# Patient Record
Sex: Female | Born: 1960 | Race: Black or African American | Hispanic: No | Smoking: Current every day smoker
Health system: Southern US, Community
[De-identification: ages and names within clinical notes are randomized; demographics above are authoritative.]

## PROBLEM LIST (undated history)

## (undated) ENCOUNTER — Ambulatory Visit
Admission: RE | Payer: No Typology Code available for payment source | Source: Ambulatory Visit | Admitting: Cardiovascular Disease

## (undated) DIAGNOSIS — F40240 Claustrophobia: Secondary | ICD-10-CM

## (undated) DIAGNOSIS — E042 Nontoxic multinodular goiter: Secondary | ICD-10-CM

## (undated) DIAGNOSIS — R262 Difficulty in walking, not elsewhere classified: Secondary | ICD-10-CM

## (undated) DIAGNOSIS — R0602 Shortness of breath: Secondary | ICD-10-CM

## (undated) DIAGNOSIS — F32A Depression, unspecified: Secondary | ICD-10-CM

## (undated) DIAGNOSIS — M1990 Unspecified osteoarthritis, unspecified site: Secondary | ICD-10-CM

## (undated) DIAGNOSIS — J302 Other seasonal allergic rhinitis: Secondary | ICD-10-CM

## (undated) DIAGNOSIS — G709 Myoneural disorder, unspecified: Secondary | ICD-10-CM

## (undated) DIAGNOSIS — Z973 Presence of spectacles and contact lenses: Secondary | ICD-10-CM

## (undated) DIAGNOSIS — R42 Dizziness and giddiness: Secondary | ICD-10-CM

## (undated) DIAGNOSIS — M545 Low back pain, unspecified: Secondary | ICD-10-CM

## (undated) DIAGNOSIS — E079 Disorder of thyroid, unspecified: Secondary | ICD-10-CM

## (undated) HISTORY — PX: BIOPSY THYROID: SHX51068

## (undated) HISTORY — DX: Presence of spectacles and contact lenses: Z97.3

## (undated) HISTORY — DX: Claustrophobia: F40.240

## (undated) HISTORY — DX: Dizziness and giddiness: R42

## (undated) HISTORY — DX: Disorder of thyroid, unspecified: E07.9

---

## 1998-08-29 ENCOUNTER — Emergency Department: Admit: 1998-08-29 | Payer: Self-pay | Admitting: Emergency Medicine

## 2001-08-06 ENCOUNTER — Emergency Department: Admit: 2001-08-06 | Payer: Self-pay | Admitting: Emergency Medicine

## 2004-03-20 ENCOUNTER — Emergency Department: Admit: 2004-03-20 | Payer: Self-pay | Source: Emergency Department | Admitting: Internal Medicine

## 2007-10-30 ENCOUNTER — Emergency Department: Admit: 2007-10-30 | Payer: Self-pay | Source: Emergency Department | Admitting: Emergency Medicine

## 2008-07-03 ENCOUNTER — Emergency Department: Admit: 2008-07-03 | Payer: Self-pay | Source: Emergency Department | Admitting: Emergency Medicine

## 2008-07-03 LAB — CBC AND DIFFERENTIAL
Basophils Absolute: 0 /mm3 (ref 0.0–0.2)
Basophils: 1 % (ref 0–2)
Eosinophils Absolute: 0.3 /mm3 (ref 0.0–0.7)
Eosinophils: 4 % (ref 0–5)
Granulocytes Absolute: 2.6 /mm3 (ref 1.8–8.1)
Hematocrit: 35.9 % — ABNORMAL LOW (ref 37.0–47.0)
Hgb: 11.8 G/DL — ABNORMAL LOW (ref 12.0–16.0)
Immature Granulocytes Absolute: 0 CUMM (ref 0.0–0.0)
Immature Granulocytes: 0 % (ref 0–1)
Lymphocytes Absolute: 3.8 /mm3 (ref 0.5–4.4)
Lymphocytes: 53 % — ABNORMAL HIGH (ref 15–41)
MCH: 29.6 PG (ref 28.0–32.0)
MCHC: 32.9 G/DL (ref 32.0–36.0)
MCV: 90 FL (ref 80.0–100.0)
MPV: 9.3 FL — ABNORMAL LOW (ref 9.4–12.3)
Monocytes Absolute: 0.5 /mm3 (ref 0.0–1.2)
Monocytes: 7 % (ref 0–11)
Neutrophils %: 36 % — ABNORMAL LOW (ref 52–75)
Platelets: 324 /mm3 (ref 140–400)
RBC: 3.99 /mm3 — ABNORMAL LOW (ref 4.20–5.40)
RDW: 12.9 % (ref 11.5–15.0)
WBC: 7.25 /mm3 (ref 3.50–10.80)

## 2008-07-03 LAB — BASIC METABOLIC PANEL
BUN: 13 MG/DL (ref 7–21)
CO2: 31 MEQ/L (ref 22–31)
Calcium: 9.7 MG/DL (ref 8.6–10.2)
Chloride: 105 MEQ/L (ref 98–107)
Creatinine: 0.8 MG/DL (ref 0.5–1.4)
Glucose: 86 MG/DL (ref 70–105)
Potassium: 4.3 MEQ/L (ref 3.6–5.0)
Sodium: 141 MEQ/L (ref 136–143)

## 2008-07-03 LAB — GFR

## 2010-01-07 ENCOUNTER — Emergency Department: Admit: 2010-01-07 | Payer: Self-pay | Source: Emergency Department | Admitting: Emergency Medicine

## 2010-01-25 ENCOUNTER — Emergency Department: Admit: 2010-01-25 | Payer: Self-pay | Source: Emergency Department | Admitting: Family

## 2011-04-27 ENCOUNTER — Ambulatory Visit: Admit: 2011-04-27 | Discharge: 2011-04-27 | Payer: Self-pay | Source: Ambulatory Visit

## 2011-05-10 ENCOUNTER — Ambulatory Visit: Admit: 2011-05-10 | Discharge: 2011-05-10 | Payer: Self-pay | Source: Ambulatory Visit

## 2011-07-21 LAB — ECG 12-LEAD
Atrial Rate: 84 {beats}/min
P Axis: 53 degrees
P-R Interval: 144 ms
Q-T Interval: 352 ms
QRS Duration: 76 ms
QTC Calculation (Bezet): 415 ms
R Axis: -20 degrees
T Axis: -3 degrees
Ventricular Rate: 84 {beats}/min

## 2012-08-08 ENCOUNTER — Emergency Department: Payer: Self-pay

## 2012-08-08 ENCOUNTER — Observation Stay: Payer: Self-pay | Admitting: Internal Medicine

## 2012-08-08 ENCOUNTER — Observation Stay
Admission: EM | Admit: 2012-08-08 | Discharge: 2012-08-10 | Disposition: A | Discharge status: Home / Self Care | Payer: Self-pay | Attending: Internal Medicine | Admitting: Internal Medicine

## 2012-08-08 ENCOUNTER — Observation Stay: Payer: Self-pay

## 2012-08-08 DIAGNOSIS — R202 Paresthesia of skin: Secondary | ICD-10-CM | POA: Diagnosis present

## 2012-08-08 DIAGNOSIS — Z720 Tobacco use: Secondary | ICD-10-CM

## 2012-08-08 DIAGNOSIS — R930 Abnormal findings on diagnostic imaging of skull and head, not elsewhere classified: Secondary | ICD-10-CM | POA: Insufficient documentation

## 2012-08-08 DIAGNOSIS — R0609 Other forms of dyspnea: Secondary | ICD-10-CM | POA: Insufficient documentation

## 2012-08-08 DIAGNOSIS — F17200 Nicotine dependence, unspecified, uncomplicated: Secondary | ICD-10-CM | POA: Insufficient documentation

## 2012-08-08 DIAGNOSIS — Z8249 Family history of ischemic heart disease and other diseases of the circulatory system: Secondary | ICD-10-CM | POA: Insufficient documentation

## 2012-08-08 DIAGNOSIS — D649 Anemia, unspecified: Secondary | ICD-10-CM | POA: Insufficient documentation

## 2012-08-08 DIAGNOSIS — Z823 Family history of stroke: Secondary | ICD-10-CM | POA: Insufficient documentation

## 2012-08-08 DIAGNOSIS — R0989 Other specified symptoms and signs involving the circulatory and respiratory systems: Secondary | ICD-10-CM | POA: Insufficient documentation

## 2012-08-08 DIAGNOSIS — R269 Unspecified abnormalities of gait and mobility: Secondary | ICD-10-CM | POA: Insufficient documentation

## 2012-08-08 DIAGNOSIS — E669 Obesity, unspecified: Secondary | ICD-10-CM | POA: Insufficient documentation

## 2012-08-08 DIAGNOSIS — R42 Dizziness and giddiness: Secondary | ICD-10-CM | POA: Insufficient documentation

## 2012-08-08 DIAGNOSIS — R209 Unspecified disturbances of skin sensation: Principal | ICD-10-CM | POA: Insufficient documentation

## 2012-08-08 DIAGNOSIS — G43909 Migraine, unspecified, not intractable, without status migrainosus: Secondary | ICD-10-CM | POA: Insufficient documentation

## 2012-08-08 DIAGNOSIS — E041 Nontoxic single thyroid nodule: Secondary | ICD-10-CM | POA: Insufficient documentation

## 2012-08-08 DIAGNOSIS — E042 Nontoxic multinodular goiter: Secondary | ICD-10-CM | POA: Clinically undetermined

## 2012-08-08 LAB — URINALYSIS, REFLEX TO MICROSCOPIC EXAM IF INDICATED
Blood, UA: NEGATIVE
Glucose, UA: NEGATIVE
Ketones UA: NEGATIVE
Nitrite, UA: NEGATIVE
Protein, UR: 30 — AB
Specific Gravity UA: 1.02 (ref 1.001–1.035)
Urine pH: 8 (ref 5.0–8.0)
Urobilinogen, UA: NORMAL mg/dL

## 2012-08-08 LAB — POCT GLUCOSE: Whole Blood Glucose POCT: 87 mg/dL (ref 70–100)

## 2012-08-08 LAB — GFR: EGFR: >60

## 2012-08-08 LAB — CBC AND DIFFERENTIAL
Basophils Absolute Automated: 0.07 10*3/uL (ref 0.00–0.20)
Basophils Automated: 1 % (ref 0–2)
Eosinophils Absolute Automated: 0.28 10*3/uL (ref 0.00–0.70)
Eosinophils Automated: 4 % (ref 0–5)
Hematocrit: 35.4 % — ABNORMAL LOW (ref 37.0–47.0)
Hgb: 11.6 g/dL — ABNORMAL LOW (ref 12.0–16.0)
Immature Granulocytes Absolute: 0.01 10*3/uL
Immature Granulocytes: 0 % (ref 0–1)
Lymphocytes Absolute Automated: 3.67 10*3/uL (ref 0.50–4.40)
Lymphocytes Automated: 55 % — ABNORMAL HIGH (ref 15–41)
MCH: 29.1 pg (ref 28.0–32.0)
MCHC: 32.8 g/dL (ref 32.0–36.0)
MCV: 88.9 fL (ref 80.0–100.0)
MPV: 9 fL — ABNORMAL LOW (ref 9.4–12.3)
Monocytes Absolute Automated: 0.37 10*3/uL (ref 0.00–1.20)
Monocytes: 6 % (ref 0–11)
Neutrophils Absolute: 2.32 10*3/uL (ref 1.80–8.10)
Neutrophils: 35 % — ABNORMAL LOW (ref 52–75)
Nucleated RBC: 0 /100 WBC (ref 0–1)
Platelets: 306 10*3/uL (ref 140–400)
RBC: 3.98 10*6/uL — ABNORMAL LOW (ref 4.20–5.40)
RDW: 13 % (ref 12–15)
WBC: 6.71 10*3/uL (ref 3.50–10.80)

## 2012-08-08 LAB — COMPREHENSIVE METABOLIC PANEL
ALT: 7 U/L (ref 0–55)
AST (SGOT): 12 U/L (ref 5–34)
Albumin/Globulin Ratio: 1 (ref 0.9–2.2)
Albumin: 3.7 g/dL (ref 3.5–5.0)
Alkaline Phosphatase: 67 U/L (ref 40–150)
BUN: 9 mg/dL (ref 7–21)
Bilirubin, Total: 0.3 mg/dL (ref 0.2–1.2)
CO2: 27 mEq/L (ref 22–29)
Calcium: 9.5 mg/dL (ref 8.5–10.5)
Chloride: 106 mEq/L (ref 98–107)
Creatinine: 0.8 mg/dL (ref 0.6–1.0)
Globulin: 3.6 g/dL (ref 2.0–3.6)
Glucose: 87 mg/dL (ref 70–100)
Potassium: 4.2 mEq/L (ref 3.5–5.1)
Protein, Total: 7.3 g/dL (ref 6.0–8.3)
Sodium: 140 mEq/L (ref 136–145)

## 2012-08-08 LAB — VITAMIN B12: Vitamin B-12: 334 pg/mL (ref 211–911)

## 2012-08-08 LAB — URINE ICTOTEST

## 2012-08-08 LAB — TSH: TSH: 1.59 u[IU]/mL (ref 0.35–4.94)

## 2012-08-08 MED ORDER — LORAZEPAM 2 MG/ML IJ SOLN
2.00 mg | Freq: Once | INTRAMUSCULAR | Status: AC
Start: 2012-08-08 — End: 2012-08-08
  Administered 2012-08-08: 2 mg via INTRAVENOUS
  Filled 2012-08-08: qty 1

## 2012-08-08 MED ORDER — ASPIRIN 325 MG PO TABS
325.00 mg | ORAL_TABLET | Freq: Every day | ORAL | Status: AC
Start: 2012-08-09 — End: 2012-08-09
  Administered 2012-08-09: 325 mg via ORAL
  Filled 2012-08-08: qty 1

## 2012-08-08 MED ORDER — ASPIRIN 81 MG PO CHEW
81.00 mg | CHEWABLE_TABLET | Freq: Every day | ORAL | Status: DC
Start: 2012-08-08 — End: 2012-08-08
  Administered 2012-08-08: 81 mg via ORAL
  Filled 2012-08-08: qty 1

## 2012-08-08 NOTE — ED Provider Notes (Signed)
Physician/Midlevel provider first contact with patient: 08/08/12 1349         History     Chief Complaint   Patient presents with   . Numbness     Patient is a 51 y.o. female presenting with neurologic complaint. The history is provided by the patient. No language interpreter was used.   Neurologic Problem  The primary symptoms include dizziness (for the past 3 days). Episode onset: 3 days ago. The symptoms are unchanged. Context: while working.   Onset quality: 3 days ago. The dizziness has been unchanged since its onset. It is a new problem. Associated with: unknown. Dizziness does not occur with weakness.   Additional symptoms do not include weakness.         51 y/o female with no MHx ad FHx of stroke (mother, sister and brother), presents to ED c/o dizziness with numbness on left side of body (left arm, hand, and leg) for the past 3 days. Pt states the dizziness came on suddenly and she experienced some numbness in her left hand on the first day and then numbness in her left leg on the second day. Pt states while she's at work she has to sit down due to the dizziness. Pt has never had previous similar sx in the past. Pt smokes cigarettes. Pt unsure of last physical exam. Pt denies weakness, CP, or SOB. History reviewed. No pertinent past medical history.    History reviewed. No pertinent past surgical history.    History reviewed. No pertinent family history.    Social  History   Substance Use Topics   . Smoking status: Current Every Day Smoker -- 4.0 packs/day for 40 years     Types: Cigarettes   . Smokeless tobacco: Not on file   . Alcohol Use: No       .     No Known Allergies    Current/Home Medications    No medications on file        Review of Systems   Respiratory: Negative for shortness of breath.    Cardiovascular: Negative for chest pain.   Neurological: Positive for dizziness (for the past 3 days) and numbness (left side (left arm, hand, and leg) for the past 3 days). Negative for weakness.   All  other systems reviewed and are negative.        Physical Exam    BP 128/79  Pulse 77  Temp 97.6 F (36.4 C) (Oral)  Resp 16  Ht 1.524 m  Wt 99.791 kg  BMI 42.97 kg/m2  SpO2 98%    Physical Exam  Nursing note and vitals reviewed.  Constitutional:  Well developed, well nourished. Awake & Oriented x3.  Head:  Atraumatic. Normocephalic.    Eyes:  PERRL. EOMI. Conjunctivae are not pale.  ENT:  Mucous membranes are moist and intact. Oropharynx is clear and symmetric.  Patent airway.  Neck:  Supple. Full ROM.    Cardiovascular:  Regular rate. Regular rhythm. No murmurs, rubs, or gallops.  Pulmonary/Chest:  No evidence of respiratory distress. Clear to auscultation bilaterally.  No wheezing, rales or rhonchi.   Abdominal:  Soft and non-distended. There is no tenderness. No rebound, guarding, or rigidity.  Back:  Full ROM. Nontender.  Extremities:  No edema. No cyanosis. No clubbing. Full range of motion in all extremities.  Skin:  Skin is warm and dry.  No diaphoresis. No rash.   Neurological:  Alert, awake, and appropriate. Normal speech. Motor normal.  Psychiatric:  Good eye contact. Normal interaction, affect, and behavior.      MDM and ED Course     ED Medication Orders      Start     Status Ordering Provider    08/08/12 2045   LORazepam (ATIVAN) injection 2 mg   Once      Route: Intravenous  Ordered Dose: 2 mg         Last MAR action:  Given Merranda Bolls C                 MDM  LABORATORY RESULTS: Ordered and independently interpreted available laboratory tests.  Labs Reviewed   CBC AND DIFFERENTIAL - Abnormal; Notable for the following:     RBC 3.98 (*)      Hgb 11.6 (*)      Hematocrit 35.4 (*)      MPV 9.0 (*)      Neutrophils 35 (*)      Lymphocytes Automated 55 (*)      All other components within normal limits   URINALYSIS, REFLEX TO MICROSCOPIC EXAM IF INDICATED - Abnormal; Notable for the following:     Leukocytes, UA Small (*)      Protein, UA 30 (*)      All other components within normal limits    LIPID PANEL - Abnormal; Notable for the following:     HDL 32 (*)      LDL Calculated 125 (*)      All other components within normal limits   BASIC METABOLIC PANEL - Abnormal; Notable for the following:     Glucose 117 (*)      All other components within normal limits   POCT GLUCOSE   COMPREHENSIVE METABOLIC PANEL   GFR   TSH   VITAMIN B12   ANA   HEMOGLOBIN A1C   URINE ICTOTEST   HEMOLYZED INDEX   GFR       IMAGING STUDIES: Imaging studies were ordered.   CT HEAD WO CONTRAST    Final Result:   No acute intracranial abnormality. Extensive paranasal     sinus disease involving the ethmoid sinuses bilaterally and left    maxillary sinus. Left maxillary sinus appears expanded and underlying    mass cannot be excluded.   XR CHEST AP PORTABLE    Final Result:  No acute cardiopulmonary process.        MRI BRAIN WO CONTRAST    Final Result:       1. No acute intracranial findings.    2. Extensive paranasal sinus inflammatory change, most notably of the    right left maxillary sinus which is completely opacified. An underlying    mass is not excluded.    3. Apparent expansion of the sella turcica without definite mass seen.    Comparison with prior would be helpful; otherwise, this can be further    evaluated with MRI with pituitary protocol.    4. Decreased T1 marrow signal within the upper cervical spine, could    represent sclerosis; however, is indeterminate.  Cervical MR is    recommended for further evaluation.   US CAROTID DUPLEX DOPP COMP BILATERAL    Final Result:       1. Moderately elevated peak systolic velocities within the ICAs    bilaterally, without high-grade stenosis seen.      2. A 2.3 cm cystic and solid right thyroid nodule. Dedicated ultrasound    of the thyroid is recommended for further evaluation.  CT CERVICAL SPINE W WO CONTRAST    Final Result:  Degenerative changes involving the cervical spine. No focal     lesion is noted within the bone marrow. The prominent decreased signal    intensity  within the bone marrow on MRI may be related to a marrow    replacement process such as myelofibrosis or a metabolic abnormality.         Sinusitis.         Enlarged adenoids and palatine tonsils.         Large sella turcica.         Thyroid nodules.         [place] HEAD NECK SOFT TISSUE    Final Result:      1. Bilateral thyroid lesions as outlined above. The gland is enlarged    and the findings are most consistent with a multinodular goiter. There    is a dominant mass within the right lobe of the gland which demonstrates    prominent internal vascularity. Fine needle aspiration of this lesion is    recommended and may be performed on an elective outpatient basis.   US CAROTID DUPLEX DOPP LTD LEFT    (Results Pending)   MRI BRAIN W WO CONTRAST    (Results Pending)       MEDICAL DECISION MAKING:    Oxygen Saturation by Pulse Oximetry: 98%  Interventions: Patient Observed.  Interpretation: Normal    Vital Signs: Reviewed the patient's vital signs.   Nursing Notes: Reviewed and utilized available nursing notes.  Medical Records Reviewed: Reviewed available past medical records.    Cardiac Monitor Interpretation:  Rate: 95  Rhythm: NSR    EKG Interpretation:   Signed and interpreted by the EP.   Time Interpreted: 220  Rate: 80  Rhythm: nsr  Interpretation: low voltage.  Normal axis and intervals.  Comparison:    Counseling: The emergency provider has spoken with the patient and discussed today's findings, in addition to providing specific details for the plan of care. Questions are answered and there is agreement with the plan.     Scribe Attestation: Barrister's clerk by Corinne Ports, acting as scribe for, and in the presence of Kennith Maes, MD .     Physician Attestation: I, Kennith Maes, MD, have been the primary provider for Bonnielee Haff during this Emergency Dept visit and have reviewed the chart documented by the scribe for accuracy and agree with its content.    DW DR. Melvenia Beam WHO WILL ADMIT FOR PARESTHESIA AND DR  Welton Flakes NEUROLOGY WHO WILL CONSULT.  INITIAL CT DONE TO RO BLEED OR LARGE CVA NEGATIVE.  NO TPA AS SX FOR 3 DAYS AND NOT CLEARLY CVA.  Procedures    Clinical Impression & Disposition     Clinical Impression  Final diagnoses:   Paresthesias   Thyroid nodule   Tobacco abuse        ED Disposition     Observation Admitting Physician: Mardene Celeste [[id number]]  Diagnosis: Paresthesias [227675]  Estimated Length of Stay: < 2 midnights  Tentative Discharge Plan?: Home or Self Care [1]  Patient Class: Observation [104]             New Prescriptions    No medications on file               Kennith Maes, MD  08/09/12 [id number]

## 2012-08-08 NOTE — Plan of Care (Signed)
A&O x3. NIH strokes scale completed after pt arrived to unit. No reports of any dizziness, and pt stated her numbness has resolved. NSR on tele monitor, VSS no signs or reports of any distress by patient. Seen by Dr. Melvenia Beam in room. SCD's applied per order. Pt oriented to room and bed. Call bell and table within reach. Will continue to monitor hourly.

## 2012-08-08 NOTE — Consults (Signed)
Service Date: 08/08/2012     Patient Type: V     CONSULTING PHYSICIAN: Leitha Bleak MD     REFERRING PHYSICIAN:      CONSULTING SERVICE:  Neurology.     REASON FOR CONSULTATION:  Paresthesias.     HISTORY OF PRESENT ILLNESS:  This is a 51 year old right-handed female whose past medical history the  patient says is not significant for any medical problems other than  depression and migraine.  The patient recently presented 3 days ago started  having dizziness which he described as lightheadedness and continued to  have that getting worse.  No speech, memory problems.  The patient had some  symptoms on her face which has resolved, continued to have dizziness and  paresthesias on the left side.  She denies having headaches, had migraine  in the past which was different from this.  Never had symptoms like this  before.  The patient admits having cough and recent flu.     PAST MEDICAL HISTORY:  Otherwise unremarkable.     SOCIAL HISTORY:  She is a smoker, about 7 cigarettes per day.  Denies drinking.     FAMILY HISTORY:  Sister had a stroke at young age.  Mother had heart problems.     PHYSICAL EXAMINATION:  VITAL SIGNS:  Blood pressure was 143/80 and pulse is 95.  GENERAL:  Head is atraumatic, normocephalic.  LUNGS:  Clear.  HEART:  S1, S2 regular.  NEUROLOGIC:  She is oriented x3.  Denies memory problems, speech is fluent.   Cranial nerve examination:  2-mm reactive pupils.  Extraocular movements  intact.  Visual fields are full.  Face is symmetric.  Tongue, uvula  midline.  Sensation intact.  Hearing is intact.  Motor strength in upper  extremity 5/5, no drift.  Finger-to-nose:  No ataxia.  Lower extremity, she  cannot give full effort, not able to raise it fully up against gravity.   The patient says at home she is able to walk by herself.  Her reflexes are  overall depressed.  She admits to pinprick, temperature, vibration intact  and symmetric.  There is no apparent clonus.     LABORATORY DATA:  Workup shows that  she has WBC of 6.71, hemoglobin of 11, hematocrit 35.4,  and BUN and creatinine 9 and 0.8.  CT scan of the brain was done and showed  no acute abnormalities, but sinus problem.     ASSESSMENT AND PLAN:  This is a 51 year old female who presented with dizziness that started 3  days ago.  The patient continued to have dizziness.  No headache.  The  patient reported left side paresthesias which comes and goes, never had  like this before.  He has history of migraines.  The patient also reported  difficulty sleeping, snoring, tired and fatigued.  Her Mallampati score of  4, impression to me is the patient may have underlying sleep apnea problem  given her exam, her weight, and her symptoms.  I would recommend at this  time doing an MRI excluding any other possible etiology and carotid  Doppler, put her on aspirin, check B12, TSH, ANA as well.  If the workup is  negative, patient to be discharged to follow up with neurology to have a  sleep study done.           D:  08/08/2012 18:20 PM by Dr. Leitha Bleak, MD ([id number])  T:  08/08/2012 22:30 PM by Dionicia Abler      (  Conf: [id number]) (Doc ID: [id number])

## 2012-08-08 NOTE — H&P (Signed)
ADMISSION HISTORY AND PHYSICAL EXAM    Date Time: 08/08/2012 10:35 PM  Patient Name: Kristina [name]  Attending Physician: Eston Esters*  Primary Care Physician: Christa See, MD    CC: Left-sided facial, upper extremity, and lower extremity paresthesias.      Assessment:     51 yo RHD AAF with significant family hx of cardio- and cerebrovascular disease, presenting with unilateral, left-sided paresthesias, etiology unknown, concerning for TIA.    Plan:   1. Admit for observation  2. Continue PO ASA  3. Neuro consultation    Disposition:     Anticipated medical stability for discharge: October,  25 - Afternoon  Service status: Observation: Due to symptoms concerning for cerebral ischemia  Reason for ongoing hospitalization: observation and further evaluation as indicated  Anticipated discharge needs: TBD      History of Presenting Illness:   Kristina [name] is [name] 51 y.o. right-hand dominant female who presents to the hospital with [name] 3d hx of left sided numbness/paresthesias.  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  Subsequent involvement included the left side of the face along the cheek; left flank of the torso, with incomplete extension into the left leg; she denies any involvement of the left breast.  Associated symptoms included vertiginous sensation aggravated by upward gaze and laying flat; improved with eyes in center gaze position and sitting more upright.  Patient reports gait instability from vestibular symptoms but denies any falls.  She also describes left-sided weakness, atlhough it is unclear if she is describing incoordination [[name] proprioceptive problem] vs true loss of power.  Owing to the persistence and progression of her symptoms, she presented to the ER for further evaluation.    Past Medical History:   -no significant PMH  -no chronic medical problems  -no hx of hospitalization  x for childbirth    OB/GYN:  -G1P1001, no complications    Past Surgical History:   -noncontributory    Family History:   -father deceased kidney disease  -son deceased kidney disease  -mother CAD s/p triple bypass  -brother CVA  -sister CVA    Social History:     History   Smoking status   . Current Every Day Smoker -- 4.0 packs/day for 40 years   . Types: Cigarettes     Alcohol Use No       Allergies:   No Known Allergies    Medications:   -aspirin daily    Review of Systems:   Remaining ROS negative/noncontributory    Physical Exam:   Patient Vitals for the past 24 hrs:   BP Temp Temp src Pulse Resp SpO2 Height Weight   08/08/12 2124 139/85 mmHg 98.2 F (36.8 C) - 76  18  98 % - -   08/08/12 1912 136/75 mmHg 98.6 F (37 C) - 80  18  100 % - -   08/08/12 1355 143/80 mmHg 98.2 F (36.8 C) Oral 95  18  97 % 1.524 m (5') 99.791 kg (220 lb)     Body mass index is 42.97 kg/(m^2).  No intake or output data in the 24 hours ending 08/08/12 2235    AFVSS; WDOWAAF, NAD, AAOx4, breathing unlabored  HEENT: PERRL, EOMI without nystagmus or dysmetria; sclerae anicteric, conjunctivae without injection  Neck: supple, no LAD/TTP; carotids brisk/symmetric, no bruits  Cardiovascular: precordium without T/H/L; heart sounds soft, RRR, nl S1/S2,  no m/r/g  Lungs: clear to auscultation bilaterally; good air movement  Abdomen: NABS; soft, non-tender, non-distended  Extremities: no pallor, cyanosis, or edema  Neuro: cranial nerves grossly intact, strength 5/5, sensation grossly intact   Skin: no rashes or lesions noted    Labs:     Results     Procedure Component Value Units Date/Time    Vitamin B12 [[id number]] Collected:08/08/12 1516    Specimen Information:Blood Updated:08/08/12 2142     Vitamin B-12 334 pg/mL     ANA Screen [[id number]] Collected:08/08/12 1516    Specimen Information:Blood Updated:08/08/12 2110    Hemoglobin A1C [[id number]] Collected:08/08/12 1516    Specimen Information:Blood Updated:08/08/12 2108    TSH [[id number]]  Collected:08/08/12 1516    Specimen Information:Blood Updated:08/08/12 1933     Thyroid Stimulating Hormone 1.59 uIU/mL     Urine Ictotest [[id number]] Collected:08/08/12 1845     Urine Ictotest see below Updated:08/08/12 1905    UA, Reflex to Microscopic [[id number]]  (Abnormal) Collected:08/08/12 1845     Urine Type Random Updated:08/08/12 1903     Color, UA Yellow      Clarity, UA Clear      Specific Gravity UA 1.020      Urine pH 8.0      Leukocytes, UA Small ([name])      Nitrite, UA Negative      Protein, UA 30 ([name])      Glucose, UA Negative      Ketones UA Negative      Urobilinogen, UA Normal mg/dL      Bilirubin, UA See Icto      Blood, UA Negative      RBC, UA 0 - 5 /HPF      WBC, UA 0 - 5 /HPF      Squamous Epithelial Cells, Urine 0 - 5 /HPF      Urine Bacteria Rare /HPF     Comprehensive metabolic panel [[id number]] Collected:08/08/12 1516    Specimen Information:Blood Updated:08/08/12 1548     Glucose 87 mg/dL      BUN 9 mg/dL      Creatinine 0.8 mg/dL      Sodium 474 mEq/L      Potassium 4.2 mEq/L      Chloride 106 mEq/L      CO2 27 mEq/L      Calcium 9.5 mg/dL      Protein, Total 7.3 g/dL      Albumin 3.7 g/dL      AST (SGOT) 12 U/L      ALT 7 U/L      Alkaline Phosphatase 67 U/L      Bilirubin, Total 0.3 mg/dL      Globulin 3.6 g/dL      Albumin/Globulin Ratio 1.0     GFR [[id number]] Collected:08/08/12 1516     EGFR >60.0 Updated:08/08/12 1548    CBC and differential [[id number]]  (Abnormal) Collected:08/08/12 1516    Specimen Information:Blood Updated:08/08/12 1522     WBC 6.71 x10 3/uL      RBC 3.98 (L) x10 6/uL      Hgb 11.6 (L) g/dL      Hematocrit 18.8 (L) %      MCV 88.9 fL      MCH 29.1 pg      MCHC 32.8 g/dL      RDW 13 %      Platelets 306 x10 3/uL      MPV 9.0 (L) fL      Neutrophils 35 (L) %  Lymphocytes Automated 55 (H) %      Monocytes 6 %      Eosinophils Automated 4 %      Basophils Automated 1 %      Immature Granulocyte 0 %      Nucleated RBC 0 /100 WBC      Neutrophils Absolute 2.32 x10 3/uL       Abs Lymph Automated 3.67 x10 3/uL      Abs Mono Automated 0.37 x10 3/uL      Abs Eos Automated 0.28 x10 3/uL      Absolute Baso Automated 0.07 x10 3/uL      Absolute Immature Granulocyte 0.01 x10 3/uL     Accucheck [[id number]] Collected:08/08/12 1519     POCT Glucose WB 87 mg/dL [id number]:91/47/82 9562          RADS:   -noncon CT head: Impression: No acute intracranial abnormality. Extensive paranasal  sinus disease involving the ethmoid sinuses bilaterally and left  maxillary sinus. Left maxillary sinus appears expanded and underlying  mass cannot be excluded.    -noncon MRI brain: Impression: 1. No acute intracranial findings. 2. Extensive paranasal sinus inflammatory change, most notably of the right left maxillary sinus which is completely opacified. An underlying mass is not excluded. 3. Apparent expansion of the sella turcica without definite mass seen. Comparison with prior would be helpful; otherwise, this can be further evaluated with MRI with pituitary protocol. 4. Decreased T1 marrow signal within the upper cervical spine, could represent sclerosis; however, is indeterminate. Cervical MR is recommended for further evaluation.    -carotid duplex: Impression: 1. Moderately elevated peak systolic velocities within the ICAs bilaterally, without high-grade stenosis seen. 2. [name] 2.3 cm cystic and solid right thyroid nodule. Dedicated ultrasound of the thyroid is recommended for further evaluation.    Safety Checklist  DVT prophylaxis:  CHEST guideline (See page [id number]) Mechanical   Foley: Not present   IVs:  Peripheral IV   PT/OT: Not needed       Signed by: Mardene Celeste, MD

## 2012-08-08 NOTE — ED Notes (Addendum)
Intermittent dizziness and numbness to left side; denies pain; no chest pain; no  Sob; supine worsens dizziness; standing lessens dizziness

## 2012-08-09 ENCOUNTER — Observation Stay: Payer: Self-pay

## 2012-08-09 DIAGNOSIS — E041 Nontoxic single thyroid nodule: Secondary | ICD-10-CM

## 2012-08-09 DIAGNOSIS — F17200 Nicotine dependence, unspecified, uncomplicated: Secondary | ICD-10-CM

## 2012-08-09 DIAGNOSIS — R209 Unspecified disturbances of skin sensation: Secondary | ICD-10-CM

## 2012-08-09 LAB — ECG 12-LEAD
Atrial Rate: 80 {beats}/min
P Axis: 45 degrees
P-R Interval: 126 ms
Q-T Interval: 368 ms
QRS Duration: 72 ms
QTC Calculation (Bezet): 424 ms
R Axis: -18 degrees
T Axis: -8 degrees
Ventricular Rate: 80 {beats}/min

## 2012-08-09 LAB — BASIC METABOLIC PANEL
BUN: 11 mg/dL (ref 7–21)
CO2: 26 mEq/L (ref 22–29)
Calcium: 9.3 mg/dL (ref 8.5–10.5)
Chloride: 105 mEq/L (ref 98–107)
Creatinine: 0.7 mg/dL (ref 0.6–1.0)
Glucose: 117 mg/dL — ABNORMAL HIGH (ref 70–100)
Potassium: 4.1 mEq/L (ref 3.5–5.1)
Sodium: 139 mEq/L (ref 136–145)

## 2012-08-09 LAB — LIPID PANEL
Cholesterol / HDL Ratio: 5.7 Index
Cholesterol: 182 mg/dL (ref 0–199)
HDL: 32 mg/dL — ABNORMAL LOW (ref >40)
LDL Calculated: 125 mg/dL — ABNORMAL HIGH (ref 0–99)
Triglycerides: 125 mg/dL (ref 34–149)
VLDL Calculated: 25 mg/dL (ref 10–40)

## 2012-08-09 LAB — HEMOGLOBIN A1C: Hemoglobin A1C: 5.8 % (ref 0.0–6.0)

## 2012-08-09 LAB — GFR: EGFR: >60

## 2012-08-09 LAB — HEMOLYSIS INDEX: Hemolysis Index: 4 Index (ref 0–9)

## 2012-08-09 LAB — ANA SCREEN ONLY: ANA Screen: NEGATIVE

## 2012-08-09 MED ORDER — GADOPENTETATE DIMEGLUMINE 469.01 MG/ML IV SOLN
10.00 mL | Freq: Once | INTRAVENOUS | Status: AC | PRN
Start: 2012-08-09 — End: 2012-08-09

## 2012-08-09 MED ORDER — IOHEXOL 350 MG/ML IV SOLN
100.00 mL | Freq: Once | INTRAVENOUS | Status: AC | PRN
Start: 2012-08-09 — End: 2012-08-09
  Administered 2012-08-09: 100 mL via INTRAVENOUS

## 2012-08-09 MED ORDER — IOHEXOL 350 MG/ML IV SOLN
INTRAVENOUS | Status: AC
Start: 2012-08-09 — End: 2012-08-10
  Filled 2012-08-09: qty 100

## 2012-08-09 MED ORDER — LORAZEPAM 2 MG/ML IJ SOLN
1.00 mg | Freq: Once | INTRAMUSCULAR | Status: DC
Start: 2012-08-09 — End: 2012-08-09
  Filled 2012-08-09: qty 1

## 2012-08-09 MED ORDER — GADOPENTETATE DIMEGLUMINE 469.01 MG/ML IV SOLN
INTRAVENOUS | Status: AC
Start: 2012-08-09 — End: 2012-08-09
  Administered 2012-08-09: 10 mL via INTRAVENOUS
  Filled 2012-08-09: qty 10

## 2012-08-09 MED ORDER — LORAZEPAM 2 MG/ML IJ SOLN
1.00 mg | Freq: Once | INTRAMUSCULAR | Status: AC
Start: 2012-08-09 — End: 2012-08-09
  Administered 2012-08-09: 1 mg via INTRAVENOUS

## 2012-08-09 NOTE — SLP Eval Note (Cosign Needed)
[facility]  [address]  [place], [place] [address]  [phone]    SPEECH LANGUAGE COGNITIVE SWALLOW EVALUATION    Patient: Kristina [name]    MRN#: [id number]    Time of treatment:0845-0945    Consult received for Kristina [name] for SLP Evaluation and Treatment.    Current Hospitalization:    Referring Physician: Dr. Melvenia Beam    Date of Referral: 08/09/2012      Medical Diagnosis: Paresthesias [782.0]  [id number] [id number]    History of Present Illness:   Kristina [name] is a 51 y.o. female admitted on  08/08/2012 with Paresthesias [782.0]  [id number] [id number]    Past Medical/Surgical History:  History reviewed. No pertinent past medical history.  History reviewed. No pertinent past surgical history.    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    Subjective: Patient is agreeable to participation in the therapy session. Patient's medical condition is  appropriate for Speech therapy intervention at this time.       Objective: Completed SLP and swallow evaluation with formal and informal measures. RIPA--3,3,3,3,3,3,3,3,2,2,=28/30.    Educated the patient to role of speech therapy.    Clinical Impression: Kristina [name] is a 51 y.o. female admitted 08/08/2012 for  Paresthesias [782.0]  [id number] [id number] presenting with speech, language and cognition WFL. Pt shows adequate communication, attention, organization, problem solving and memory. She is able to read a newspaper paragraph and recall information well. She is able to describe steps involving food preparation with good detail, word retrieval and organization. Pt reports no changes in the above areas. Swallow is [facility] for all consistencies as seen with breakfast. No s/s of aspiration noted.    G Codes:     Swallowing G Code Set  Swallowing, Current Status (W1027): CH  Swallowing, Goal Status (O5366): CH  Swallowing, D/C Status (Y4034): CH                               Other SLP G Code Set  Other Speech Language Pathology, Current Status [phone]): [facility]  Other Speech Language Pathology, Goal Status (D6387): CH  Other Speech Language Pathology, D/C Status (F6433): CH  Tools used to determine level of impairment::  (clinical judgment)      Plan: Patient does not need SLP follow-up           Goals: n/a    Attention Dr. Melvenia Beam,  Thank you for allowing [place] to participate in the care of your patient. Regulations from the [employer] for Medicare and Medicaid Services (CMS) require your review and approval of this plan of care.     Please cosign the SLP Eval Note indicating you are in agreement with the SLP Plan of Care so we may initiate the therapy treatment plan.    Signature:Frani Rayman, M.S. CCC-SLP

## 2012-08-09 NOTE — Progress Notes (Signed)
Progress Note    Date Time: 08/09/2012 10:05 AM  Patient Name: Kristina [name],Kristina [name] A  Attending Physician: Eston Esters*      Assesment/Plan     Patient is awake , symptoms almost resolved, mild headaches  MRI brain to have pituitary protocol and cervical spine to rule out mass.  Labs unremarkable.  PLAN  Discharge the patient to home after mri brain and cervical spine done and unremarkable.  Follow up neuro in one week  Also to have cleared by PT for gait and walking, although patient denies problem walking or back pain, said walked this morning, but the fact not able to give full effort need clearance by PT as well.      Subjective:   Patient Seen and Examined. The notes from the last 24 hours were reviewed.       Review of Systems:   No new deficit    Physical Exam:     Filed Vitals:    08/09/12 0747   BP: 128/79   Pulse: 77   Temp: 97.6 F (36.4 C)   Resp: 16   SpO2: 98%       HEENT: Normocephalic. No icter or congestion  Neck: supple, no lymphadenopathy, no thyromegaly, no JVD  Skin: no rashes or lesions noted    Neuro:  Level of consciousness:  Alert and appropriate  Oriented:  X 3  Pupils: PERLA, EOMI  Facial Movements: symmetric  Strength:  No upper extremity drift  Sensation to light touch: Intact bilaterally  Gait not checked          Meds:     Scheduled Meds:  Current Facility-Administered Medications   Medication Dose Route Frequency   . aspirin  325 mg Oral Daily   . [COMPLETED] LORazepam  2 mg Intravenous Once   . [DISCONTINUED] aspirin  81 mg Oral Daily     Continuous Infusions:   PRN Meds:.  I personally reviewed all of the medications      Labs:     Results     Procedure Component Value Units Date/Time    Lipid panel [[id number]]  (Abnormal) Collected:08/09/12 0550    Specimen Information:Blood Updated:08/09/12 0831     Cholesterol 182 mg/dL      Triglycerides 621 mg/dL      HDL 32 (L) mg/dL      LDL Calculated 308 (H) mg/dL      VLDL Cholesterol Cal 25 mg/dL      CHOL/HDL Ratio 5.7 Index      Hemolyzed Index [[id number]] Collected:08/09/12 0550     Hemolyzed Index 4 Index Updated:08/09/12 0831    Hemoglobin A1C [[id number]] Collected:08/08/12 1516    Specimen Information:Blood Updated:08/09/12 0408     Hemoglobin A1C 5.8 %     Vitamin B12 [[id number]] Collected:08/08/12 1516    Specimen Information:Blood Updated:08/08/12 2142     Vitamin B-12 334 pg/mL     ANA Screen [[id number]] Collected:08/08/12 1516    Specimen Information:Blood Updated:08/08/12 2110    TSH [[id number]] Collected:08/08/12 1516    Specimen Information:Blood Updated:08/08/12 1933     Thyroid Stimulating Hormone 1.59 uIU/mL     Urine Ictotest [[id number]] Collected:08/08/12 1845     Urine Ictotest see below Updated:08/08/12 1905    UA, Reflex to Microscopic [[id number]]  (Abnormal) Collected:08/08/12 1845     Urine Type Random Updated:08/08/12 1903     Color, UA Yellow      Clarity, UA Clear      Specific Gravity UA 1.020  Urine pH 8.0      Leukocytes, UA Small (A)      Nitrite, UA Negative      Protein, UA 30 (A)      Glucose, UA Negative      Ketones UA Negative      Urobilinogen, UA Normal mg/dL      Bilirubin, UA See Icto      Blood, UA Negative      RBC, UA 0 - 5 /HPF      WBC, UA 0 - 5 /HPF      Squamous Epithelial Cells, Urine 0 - 5 /HPF      Urine Bacteria Rare /HPF     Comprehensive metabolic panel [[id number]] Collected:08/08/12 1516    Specimen Information:Blood Updated:08/08/12 1548     Glucose 87 mg/dL      BUN 9 mg/dL      Creatinine 0.8 mg/dL      Sodium 932 mEq/L      Potassium 4.2 mEq/L      Chloride 106 mEq/L      CO2 27 mEq/L      Calcium 9.5 mg/dL      Protein, Total 7.3 g/dL      Albumin 3.7 g/dL      AST (SGOT) 12 U/L      ALT 7 U/L      Alkaline Phosphatase 67 U/L      Bilirubin, Total 0.3 mg/dL      Globulin 3.6 g/dL      Albumin/Globulin Ratio 1.0     GFR [[id number]] Collected:08/08/12 1516     EGFR >60.0 Updated:08/08/12 1548    CBC and differential [[id number]]  (Abnormal) Collected:08/08/12 1516    Specimen  Information:Blood Updated:08/08/12 1522     WBC 6.71 x10 3/uL      RBC 3.98 (L) x10 6/uL      Hgb 11.6 (L) g/dL      Hematocrit 37.6 (L) %      MCV 88.9 fL      MCH 29.1 pg      MCHC 32.8 g/dL      RDW 13 %      Platelets 306 x10 3/uL      MPV 9.0 (L) fL      Neutrophils 35 (L) %      Lymphocytes Automated 55 (H) %      Monocytes 6 %      Eosinophils Automated 4 %      Basophils Automated 1 %      Immature Granulocyte 0 %      Nucleated RBC 0 /100 WBC      Neutrophils Absolute 2.32 x10 3/uL      Abs Lymph Automated 3.67 x10 3/uL      Abs Mono Automated 0.37 x10 3/uL      Abs Eos Automated 0.28 x10 3/uL      Absolute Baso Automated 0.07 x10 3/uL      Absolute Immature Granulocyte 0.01 x10 3/uL     Accucheck [[id number]] Collected:08/08/12 1519     POCT Glucose WB 87 mg/dL HYWVPXT:04/12/93 8546             Radiology Results (24 Hour)     Procedure Component Value Units Date/Time    MRI Brain WO Contrast [[id number]] Collected:08/08/12 2132    Order Status:Completed  Updated:08/08/12 2145    Narrative:    INDICATION: TIA.     TECHNIQUE: Multiplanar, multisequence MRI of the brain was performed  without IV  contrast.       FINDINGS: Extensive circumferential mucosal thickening demonstrated  within the left maxillary sinus extending to the left anterior ethmoids.  There is mild circumferential mucosal thickening within the right  ethmoids. No signal abnormality seen within the visualized portion of  the mastoid air cells.     There is no restricted diffusion to suggest acute ischemia. There is no  evidence of acute hemorrhage. No mass effect, midline shift, or  hydrocephalus is present. There are no concerning areas of signal  abnormality within the brain. No foci of blooming artifact are seen on  the GRE sequence.     On the sagittal sequence, there is apparent expansion of the sella  turcica (series 2, image 12). Decreased marrow signal seen within the  visualized portions of the upper cervical spine.       Impression:        1. No acute intracranial findings.  2. Extensive paranasal sinus inflammatory change, most notably of the  right left maxillary sinus which is completely opacified. An underlying  mass is not excluded.  3. Apparent expansion of the sella turcica without definite mass seen.  Comparison with prior would be helpful; otherwise, this can be further  evaluated with MRI with pituitary protocol.  4. Decreased T1 marrow signal within the upper cervical spine, could  represent sclerosis; however, is indeterminate.  Cervical MR is  recommended for further evaluation.    US Carotid Duplex Doppler Comp [[id number]] Collected:08/08/12 2014    Order Status:Completed  Updated:08/08/12 2115    Narrative:    CLINICAL INDICATION: Left numbness and tingling.     TECHNIQUE:  Duplex vascular ultrasound exam of the carotid arteries was  performed.     FINDINGS:       (VELOCITIES)RT                      LT                   CCA PK103 YQ/MVH846 cm/sec  ICA PK135 NG/EXB284 cm/sec  ICA ED51 XL/KGM01 cm/sec  ICA/CCA ratio1.11.18  ECApatent         patent  Vertebralantegrade                    antegrade     There is mild  plaque seen in both  carotid bulbs.        Incidentally noted is a right thyroid nodule with cystic and solid  components that measures 2.3 cm in greatest diameter       Impression:       1. Moderately elevated peak systolic velocities within the ICAs  bilaterally, without high-grade stenosis seen.    2. A 2.3 cm cystic and solid right thyroid nodule. Dedicated ultrasound  of the thyroid is recommended for further evaluation.    CT Head WO Contrast [[id number]] Collected:08/08/12 1550    Order Status:Completed  Updated:08/08/12 1557    Narrative:    CLINICAL INDICATION:  Dizziness     TECHNIQUE:  Axial noncontrast CT images were obtained from the skull  base to the vertex.     COMPARISON:  07/03/2008     INTERPRETATION: Ventricles and sulcal pattern within normal limits for  age. No acute intracranial hemorrhage, extra-axial  collection, or  mass-effect. Gray-white differentiation maintained. Moderate mucosal  thickening of the ethmoid sinuses, left greater than right, with  opacification of the visualized portion of the left maxillary sinus  which  appears expanded.       Impression:      No acute intracranial abnormality. Extensive paranasal  sinus disease involving the ethmoid sinuses bilaterally and left  maxillary sinus. Left maxillary sinus appears expanded and underlying  mass cannot be excluded.    Chest AP Portable [[id number]] Collected:08/08/12 1434    Order Status:Completed  Updated:08/08/12 1439    Narrative:    CLINICAL INDICATION: Dizziness     COMPARISON: None available     INTERPRETATION: Single portable frontal view of the chest obtained.  Cardiomediastinal contour within normal limits for technique. Clear  lungs and sharp sulci.        Impression:     No acute cardiopulmonary process.            All brain imaging (MRI, CT) personally reviewed.    Case discussed with patient      Signed by: Leitha Bleak, MD

## 2012-08-09 NOTE — Plan of Care (Signed)
Problem: Physical Therapy  Goal: Patient condition is improving per Physical Therapy Treatment Plan  Outcome: Adequate for Discharge  No PT needs at this time.  D/C PT.

## 2012-08-09 NOTE — OT Eval Note (Signed)
[facility]  [address]  [place], [place] [address]  [phone]    Occupational Therapy Evaluation    Patient: Kristina [name] MRN: [id number]   Unit: [facility] INTERMEDIATE CARE Bed: MI635/MI635-01    Time of treatment: Time Calculation  OT Received On: 08/09/12  Start Time: 1030  Stop Time: 1050  Time Calculation (min): 20 min    Consult received for Bonnielee Haff for OT evaluation and treatment.  Patient's medical condition is appropriate for Occupational Therapy  intervention at this time.    Medical Diagnosis: Paresthesias [782.0]  [id number]       History of Present Illness: Kristina [name] is a 51 y.o. female admitted on  08/08/2012 with significant family hx of cardio- and cerebrovascular disease, presenting with unilateral, left-sided paresthesias, etiology unknown, concerning for TIA.    There is no problem list on file for this patient.    History reviewed. No pertinent past surgical history.      Precautions:   Fall risk    X-Rays/Tests/Labs:  Results for [place], Tennile A (MRN [id number]) as of 08/09/2012 09:09    Ref. Range  08/08/2012 15:16    Hemoglobin  Latest Range: 12.0-16.0 g/dL  02.7 (L)    Hematocrit  Latest Range: 37.0-47.0 %  35.4 (L)    MRI Brain WO Contrast (Order #[id number]) on 08/08/2012 - Imaging Information   IMPRESSION:   1. No acute intracranial findings.   2. Extensive paranasal sinus inflammatory change, most notably of the   right left maxillary sinus which is completely opacified. An underlying   mass is not excluded.   3. Apparent expansion of the sella turcica without definite mass seen.   Comparison with prior would be helpful; otherwise, this can be further   evaluated with MRI with pituitary protocol.   4. Decreased T1 marrow signal within the upper cervical spine, could   represent sclerosis; however, is indeterminate. Cervical MR is   recommended for further evaluation.    Social History:  Lives with sister in a second floor apartment.   Entry Steps: 5 to enter  building, 13 to second floor Rails: yes, bilat Inside steps: none Rails: n/a   Equipment at home: grab bars in tub shower   Prior Level of Function: Pt currently works full time for a medical supply company, does not drive   Cognition: intact   Mobility/Locomotion: independent w/ all mobility and ADLs    Subjective: Patient is agreeable to participation in the therapy session. Nursing clears patient for therapy.  Pain: Pt reported moderate pain in head/headache     Pt denied any dizziness    Objective:  Patient is in bed with  Telemetry, Intravenous (IV) and Sequential Compression Device (SCD) in place.    Observation of patient/vitals: Pt's oxygen 97%. HR in 70's.     Filed Vitals:    08/08/12 2124 08/09/12 0005 08/09/12 0417 08/09/12 0747   BP: 139/85 121/62 125/62 128/79   Pulse: 76 86 77 77   Temp: 98.2 F (36.8 C) 97.9 F (36.6 C) 97.4 F (36.3 C) 97.6 F (36.4 C)   TempSrc:    Oral   Resp: 18 18 18 16    Height:       Weight:       SpO2: 98% 98% 97% 98%       Orientation/Cognition:  Alert and Oriented x 4  Cognition: no deficits noted. No safety concerns.     Musculoskeletal Examination:  ROM Strength   RUE WFL WFL   LUE [facility] WFL     Sensation: intact. No reports of N/T   Coordination: WFL  Vision: WFL  Hearing: WFL    Functional Mobilty: (pt able to complete all functional mobility independently)  Rolling:     Scooting:  Supine to sit:   Sit to Supine:   Sit to stand:   Stand to sit:   Transfers: (toilet transfer independently)     Balance:  Sit Balance: good  Stand Balance: good    Self Care: (pt able to complete all self cares independently)   Eating:   Grooming:   Bathing:   UB Dressing:   LB Dressing:   Toileting:     Endurance: good    Participation:  good    Education:  Educated the patient to role of occupational therapy, plan of care, goals  of therapy and safety with mobility and ADLs, home safety.    Assessment:  RAMESHA [name] is a 51 y.o. female admitted 08/08/2012. Pt is at baseline,  independent with self care and functional mobilty. No further OT indicated at this time.    Rehabilitation Potential: Good; home independently with no OT needs     Patient is in bed with Telemetry, Intravenous (IV) and Sequential Compression Device (SCD), and call bell within reach. RN notified of session outcome.    OT Plan  OT Frequency Recommended:  ([place] OT)    D/C Suggestions:  Home independently  Equipment: no needs at this time    Signature:   Louie Boston, OT  08/09/2012  11:01 AM  Phone: [phone]

## 2012-08-09 NOTE — Progress Notes (Signed)
Pt is alert and oriented x3, denies any pain. No distress noted. Independently ambulate to bathroom. Will cont to monitor pt.

## 2012-08-09 NOTE — Progress Notes (Signed)
MEDICINE PROGRESS NOTE    Date Time: 08/09/2012 10:48 AM  Patient Name: EPPIR,JJOA A  Attending Physician: Eston Esters*      Assessment:   1. Left sided paresthesias - improving but still present, no clear finding on MRI.  Concern for stroke.  2. Incidental 2.3cm mixed cystic/solid right thyroid nodule - TSH in normal range  3. Tobacco abuse  4. Anemia  5. Extensive paranasal sinus inflammatory changes - mass cannot be excluded  6. MRI study concerning for cervical sclerosis  7. 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 Obesity    Plan:   #Given numerous incidental intracranial findings, agree with neurology in obtaining MRI with contrast, and Cervical MR.  #Will obtain dedicated thyroid ultrasound.  No fmhx of thyroid dx.  No personal radiation hx.  Pending further findings on dedicated thyroid ultrasound discussed with patient the need for outpatient followup with plan for FNA to rule out cancerous or other serious etiology.  #Continue Aspirin as outpatient  #Tobacco cessation counseling provided.  Patient is pre-contemplative.  #No further outpatient PT needs per assessment  #Likely d/c tomorrow AM after MRI overnight    Safety Checklist:     DVT prophylaxis:  CHEST guideline (See page [id number]) Mechanical   Foley: Not present   IVs:  Peripheral IV   PT/OT: Ordered     Lines:     Active PICC Line / CVC Line / PIV Line / Drain / Airway / Intraosseous Line / Epidural Line / ART Line / Line / Wound / Pressure Ulcer / NG/OG Tube     Name   Placement date   Placement time   Site   Days    Peripheral IV 08/08/12 Left Antecubital  08/08/12   1510   Antecubital   less than 1          Disposition:     Today's date: 08/09/2012  Anticipated medical stability for discharge: September,  24 - Morning  Reason for ongoing hospitalization: MRI overnight, thyroid ultrasound  Anticipated discharge needs: Indigent PCP referral, neurology f/u    Subjective     CC: paresthesias     Interval History/24 hour  events: admitted, imaging studies obtained    HPI/Subjective: no c/f, no cp/sob, no n/v, paresthesias with moderate interval improvement    Physical Exam:   Patient Vitals for the past 24 hrs:   BP Temp Temp src Pulse Resp SpO2 Height Weight   08/09/12 0747 128/79 mmHg 97.6 F (36.4 C) Oral 77  16  98 % - -   08/09/12 0417 125/62 mmHg 97.4 F (36.3 C) - 77  18  97 % - -   08/09/12 0005 121/62 mmHg 97.9 F (36.6 C) - 86  18  98 % - -   08/08/12 2124 139/85 mmHg 98.2 F (36.8 C) - 76  18  98 % - -   08/08/12 1912 136/75 mmHg 98.6 F (37 C) - 80  18  100 % - -   08/08/12 1355 143/80 mmHg 98.2 F (36.8 C) Oral 95  18  97 % 1.524 m (5') 99.791 kg (220 lb)     Body mass index is 42.97 kg/(m^2).  No intake or output data in the 24 hours ending 08/09/12 1048    General: awake, alert, oriented x 3; no acute distress, obese.  HEENT: right thyroid nodular moveable mass ~2x2cm.  Cardiovascular: regular rate and rhythm, no murmurs, rubs or gallops  Lungs: clear to auscultation  bilaterally, without wheezing, rhonchi, or rales  Abdomen: soft, non-tender, non-distended; no palpable masses, normoactive bowel sounds, no rebound or guarding  Extremities: no clubbing, cyanosis, or edema  Neuro: 5/5 strength throughout, no focal deficits    Meds:     Medications were reviewed    Labs:     Recent Labs   [facility] 08/08/12 1516    WBC 6.71    HGB 11.6*    HCT 35.4*    PLT 306    MCV 88.9       Recent Labs   Basename 08/08/12 1516    NA 140    K 4.2    CL 106    CO2 27    BUN 9    CREAT 0.8    GLU 87    CA 9.5    MG --    PHOS --       Recent Labs   Basename 08/08/12 1516    AST 12    ALT 7    ALKPHOS 67    PROT 7.3    ALB 3.7       No results found for this basename: PTT:2,PT:2,INR:2 in the last 72 hours    Imaging personally reviewed, including:     MRI Brain WO Contrast [[id number]]  Collected:08/08/12 2132    Order Status:Completed   Updated:08/08/12 2145    Narrative:    INDICATION: TIA.    TECHNIQUE: Multiplanar, multisequence  MRI of the brain was performed  without IV contrast.     FINDINGS: Extensive circumferential mucosal thickening demonstrated  within the left maxillary sinus extending to the left anterior ethmoids.  There is mild circumferential mucosal thickening within the right  ethmoids. No signal abnormality seen within the visualized portion of  the mastoid air cells.    There is no restricted diffusion to suggest acute ischemia. There is no  evidence of acute hemorrhage. No mass effect, midline shift, or  hydrocephalus is present. There are no concerning areas of signal  abnormality within the brain. No foci of blooming artifact are seen on  the GRE sequence.    On the sagittal sequence, there is apparent expansion of the sella  turcica (series 2, image 12). Decreased marrow signal seen within the  visualized portions of the upper cervical spine.      Impression:      1. No acute intracranial findings.  2. Extensive paranasal sinus inflammatory change, most notably of the  right left maxillary sinus which is completely opacified. An underlying  mass is not excluded.  3. Apparent expansion of the sella turcica without definite mass seen.  Comparison with prior would be helpful; otherwise, this can be further  evaluated with MRI with pituitary protocol.  4. Decreased T1 marrow signal within the upper cervical spine, could  represent sclerosis; however, is indeterminate. Cervical MR is  recommended for further evaluation.      US Carotid Duplex Doppler Comp [[id number]]  Collected:08/08/12 2014    Order Status:Completed   Updated:08/08/12 2115    Narrative:    CLINICAL INDICATION: Left numbness and tingling.    TECHNIQUE: Duplex vascular ultrasound exam of the carotid arteries was  performed.    FINDINGS:     (VELOCITIES)RT LT   CCA PK103 QM/VHQ469 cm/sec  ICA PK135 GE/XBM841 cm/sec  ICA ED51 LK/GMW10 cm/sec  ICA/CCA ratio1.11.18  ECApatent patent  Vertebralantegrade antegrade    There is mild plaque seen in both carotid bulbs.      Incidentally  noted is a right thyroid nodule with cystic and solid  components that measures 2.3 cm in greatest diameter      Impression:      1. Moderately elevated peak systolic velocities within the ICAs  bilaterally, without high-grade stenosis seen.   2. A 2.3 cm cystic and solid right thyroid nodule. Dedicated ultrasound  of the thyroid is recommended for further evaluation.      CT Head WO Contrast [[id number]]  Collected:08/08/12 1550    Order Status:Completed   Updated:08/08/12 1557    Narrative:    CLINICAL INDICATION: Dizziness    TECHNIQUE: Axial noncontrast CT images were obtained from the skull  base to the vertex.    COMPARISON: 07/03/2008    INTERPRETATION: Ventricles and sulcal pattern within normal limits for  age. No acute intracranial hemorrhage, extra-axial collection, or  mass-effect. Gray-white differentiation maintained. Moderate mucosal  thickening of the ethmoid sinuses, left greater than right, with  opacification of the visualized portion of the left maxillary sinus  which appears expanded.      Impression:    No acute intracranial abnormality. Extensive paranasal  sinus disease involving the ethmoid sinuses bilaterally and left  maxillary sinus. Left maxillary sinus appears expanded and underlying  mass cannot be excluded.      Chest AP Portable [[id number]]  Collected:08/08/12 1434    Order Status:Completed   Updated:08/08/12 1439    Narrative:    CLINICAL INDICATION: Dizziness    COMPARISON: None available    INTERPRETATION: Single portable frontal view of the chest obtained.  Cardiomediastinal contour within normal limits for technique. Clear  lungs and sharp sulci.       Impression:    No acute cardiopulmonary process.        Signed by: Marikay Alar, MD    ===========================================  [facility]   Division of Hospitalist Medicine, [facility]  [place] Pager [phone]  ===========================================

## 2012-08-09 NOTE — Plan of Care (Signed)
Kristina [name] is a 51 y.o. female here for: There is no problem list on file for this patient.  . Dizziness,left sided numbness.    Patient is stable and without questions or concerns at this time. POC reviewed and patient continues to understand diagnosis and POC and the importance of safety, risk management and pain management. Patient understands primary goal is to ensure neurological status is stable or improving. Patient diagnosis specific POC reviewed with patient for this shift:     Neurological status is stable or improving: Yes,pt has no deficits,ambulatory,using left arm and leg without any problems.  Vital signs are stable: Yes  BP 120/60  Pulse 77  Temp 98.2 F (36.8 C) (Oral)  Resp 16  Ht 1.524 m (5')  Wt 99.791 kg (220 lb)  BMI 42.97 kg/m2  SpO2 97%  Fluid balance is stable: Yes,pt eating and drinking well.  Patient is maintaining adequate oxygenation: Yes  Risk of aspiration minimized: Yes  Nutritional status andelimination patterns are normal or improving: Yes  Skin integrity is maintained or improved: Yes  Neurovascular status is stable or improving: Yes,pt still informed of fall risk when ambulating,pt has SCD's on.  Patient demonstrates effective coping: Yes   Pt awaiting for doctor to review test results with pt.Will continue neurological checks every four hours and report changes immediately to neurologist.

## 2012-08-09 NOTE — Plan of Care (Signed)
Post ISHAPE Nursing Report Note- Pt alert,oriented after report.Pt states has no paresthesia on left side but has headache on and off and presently has none.Pt informed of MRI ordered and went over all tests done today.Per pt will refuse test,but explained to pt test is to look at brain and help doctors figuire out what is going on.Pt verbalize understanding and will do test.Plan is to continue neuro checks every four hours with vitals and call MD with any changes.

## 2012-08-09 NOTE — Plan of Care (Signed)
Problem: Occupational Therapy  Goal: Patient condition is improving per Occupational Therapy Treatment Plan  Outcome: Adequate for Discharge  Pt is at baseline, independent with self care and functional mobilty. No further OT indicated at this time.

## 2012-08-09 NOTE — PT Eval Note (Cosign Needed Addendum)
[facility]  [address]  [place], [place] [address]  [phone]    Physical Therapy Evaluation    Patient: Kristina [name] MRN: [id number]   Unit: [facility] INTERMEDIATE CARE Bed: MI635/MI635-01    Time of Treatment: Time Calculation  PT Received On: 08/09/12  Start Time: 0810  Stop Time: 0845  Time Calculation (min): 35 min    Consult received for Bonnielee Haff for PT evaluation and treatment.  Patient's medical condition is appropriate for Physical Therapy  intervention at this time.    Medical Diagnosis: Paresthesias [782.0]  [id number]     History of Present Illness: Kristina [name] is a 51 y.o. female admitted on  08/08/2012 with significant family hx of cardio- and cerebrovascular disease, presenting with unilateral, left-sided paresthesias, etiology unknown, concerning for TIA.    History reviewed. No pertinent past medical history.  History reviewed. No pertinent past surgical history.    Precautions: falls    X-Rays/Tests/Labs:  Results for [facility], Dyllan A (MRN [id number]) as of 08/09/2012 09:09   Ref. Range 08/08/2012 15:16   Hemoglobin Latest Range: 12.0-16.0 g/dL 53.2 (L)   Hematocrit Latest Range: 37.0-47.0 % 35.4 (L)     MRI Brain WO Contrast (Order #[id number]) on 08/08/2012 - Imaging Information  IMPRESSION:   1. No acute intracranial findings.   2. Extensive paranasal sinus inflammatory change, most notably of the   right left maxillary sinus which is completely opacified. An underlying   mass is not excluded.   3. Apparent expansion of the sella turcica without definite mass seen.   Comparison with prior would be helpful; otherwise, this can be further   evaluated with MRI with pituitary protocol.   4. Decreased T1 marrow signal within the upper cervical spine, could   represent sclerosis; however, is indeterminate. Cervical MR is   recommended for further evaluation.    Social History:  Lives with sister in a second floor apartment.  Entry Steps: 5 to enter building, 13 to second  floor   Rails: yes, bilat Inside steps: none  Rails: n/a  Equipment at home: grab bars in tub shower  Prior Level of Function: Pt currently works full time for a medical supply company, does not drive    Cognition: intact    Mobility/Locomotion: independent w/ all mobility and ADLs     Subjective: Patient is agreeable to participation in the therapy session. Nursing clears patient for therapy.  Pain: Pt denies pain, N/T    Objective:  Patient is in bed with  Telemetry and Intravenous (IV) access in place.    Observation of patient/vitals:  Filed Vitals:    08/08/12 2124 08/09/12 0005 08/09/12 0417 08/09/12 0747   BP: 139/85 121/62 125/62 128/79   Pulse: 76 86 77 77   Temp: 98.2 F (36.8 C) 97.9 F (36.6 C) 97.4 F (36.3 C) 97.6 F (36.4 C)   Resp: 18 18 18 16    SpO2: 98% 98% 97% 98%     At rest HR 85, O2 98%  After stair assessment HR 100, O2 99%  Pt denied dizziness or paresthesias during activity    Orientation/Cognition:  Alert and Oriented x 4  Cognition: appears intact    Musculoskeletal Examination:     ROM Strength   RLE WFL WFL   LLE [facility] WFL     Sensation: Intact to light touch, localization, proprioception throughout.    Functional Mobilty:  Rolling: independent    Supine to sit: independent  Scooting:independent  Sit to Supine: independent  Sit to stand: independent  Stand to sit: indepeendent  Transfers: independent  W/C Mobility: NT  Ambulation:     Weightbearing: FWB   Assistance level: independent   Distance: 200 feet   Assistive Device: none   Gait Deviations: slow speed, wide BOS, toe out   Stairs: 13 w/ L rail, independent    Balance:  Static Sit: good  Dynamic Sit: good  Static Stand: good  Dynamic Stand: fair+    Endurance: fair    Participation:  good    Education:  Educated the patient to role of physical therapy, plan of care, goals  of therapy and safety with mobility and ADLs, energy conservation techniques.    Assessment:  Kristina [name] is a 51 y.o. female admitted 08/08/2012. Pt  currently functioning at baseline level.  No PT needs at this time.  D/C PT.    Rehabilitation Potential: good     Patient is in bed with Telemetry and Intravenous (IV)access, and call bell within reach. RN notified of session outcome.    Plan:   Risks/Benefits/POC Discussed with Pt/Family: With patient  PT Frequency: one time visit    Goals  Goal Formulation:  (No PT needs at this time)    D/C Suggestions:  Home, no PT needs    Signature:  Prince Solian, PT  08/09/2012  9:07 AM  Phone: [phone]

## 2012-08-09 NOTE — Progress Notes (Signed)
NO IDENTIFIED D/C NEEDS

## 2012-08-09 NOTE — Progress Notes (Signed)
Ortho Note: pt has scd#99

## 2012-08-10 DIAGNOSIS — E042 Nontoxic multinodular goiter: Secondary | ICD-10-CM | POA: Clinically undetermined

## 2012-08-10 DIAGNOSIS — R202 Paresthesia of skin: Secondary | ICD-10-CM | POA: Diagnosis present

## 2012-08-10 NOTE — Progress Notes (Signed)
Pt alert and oriented x3, denies any pain. No distress noted. Pt was discharge home and all instructions given as ordered. heplock discontinue. vss.

## 2012-08-10 NOTE — Discharge Instructions (Signed)
ADDITIONAL INSTRUCTIONS:'    -MAKE AN APPOINTMENT WITH DR. kAHN IF THE TINGLING OF YOUR EXTREMITIES CONTINUES OR WORSENS.  -THE TRANSITIONAL CARE CLINIC WILL CALL YOU TO MAKE APPOINTMENT TO BE SEEN REGARDING YOUR THYROID.  YOU NEED A FINE NEEDLE BIOPSY AS SOON AS POSSIBLE.

## 2012-08-10 NOTE — Discharge Summary (Signed)
PROGRESS NOTE/DISCHARGE SUMMARY      Date Time: 08/10/2012 2:37 PM  Patient Name: Kristina [name],Kristina [name] A  Attending Physician: Eston Esters*  Primary Care Physician: Christa See, MD    Date of Admission:   08/08/2012    Date of Discharge:       Discharge Dx:     Patient Active Problem List    Diagnosis Date Noted   . Tingling in extremities 08/10/2012   . Multiple thyroid nodules 08/10/2012        Consultations:   Neurology    Procedures/Radiology performed:   CT of head, neck and c-spine a   Mri of brain and pituitary  Ultrasound of neck  Carotid dopplers      Hospital Course:   Ms. Hyppolite was admitted for L sided paresthesias to r/ o TIA/CVA.      Initial evaluation was negative for CVA but studies showed thyroid nodules and enlarged della turcica. Repeat MRI of pituitary showed no evidence of disease.     She was cleared for discharge by neurology.  She was instructed to have outpatient thyroid FNA for suspicious nodule.      DISCHARGE DAY EXAM:  Patient Vitals for the past 24 hrs:   BP Temp Temp src Pulse Resp SpO2   08/10/12 1200 118/68 mmHg 98.9 F (37.2 C) Oral 76  18  99 %   08/10/12 0731 106/57 mmHg 97.7 F (36.5 C) Oral 75  18  99 %   08/10/12 0404 121/72 mmHg 97.7 F (36.5 C) Oral 70  18  99 %   08/09/12 2342 109/57 mmHg 97.7 F (36.5 C) Oral 62  18  100 %   08/09/12 1930 120/60 mmHg 98.2 F (36.8 C) Oral 77  16  97 %     Body mass index is 42.97 kg/(m^2).  General: awake, alert, oriented x 3; no acute distress.  Cardiovascular: regular rate and rhythm, no murmurs, rubs or gallops  Lungs: clear to auscultation bilaterally.  ,Abdomen: obese,soft, non-tender, non-distended.; normoactive bowel sounds.  Extremities: no edema       Discharge Medications:   There are no discharge medications for this patient.    Discharge Instructions:   Request for consult TCM to facilitate FNA of thyroid.  Follow with neurology if paresthesias persist or worsen.    Disposition:  home      Signed by: Lynnell Jude, ANP    CC: Pcp, Noneorunknown, MD

## 2012-08-10 NOTE — Progress Notes (Signed)
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

## 2012-08-10 NOTE — Progress Notes (Signed)
Reported off to [employer] to continue neuro checks with pt.

## 2012-08-10 NOTE — Plan of Care (Signed)
Pt on stroke pathway with neuro checks Q4H.  VS stable and SCD on.  Will continue to monitor.

## 2014-01-18 ENCOUNTER — Observation Stay: Payer: Self-pay | Admitting: Internal Medicine

## 2014-01-18 ENCOUNTER — Emergency Department: Payer: Charity

## 2014-01-18 ENCOUNTER — Observation Stay
Admission: EM | Admit: 2014-01-18 | Discharge: 2014-01-19 | Disposition: A | Discharge status: Home / Self Care | Payer: Charity | Attending: Internal Medicine | Admitting: Internal Medicine

## 2014-01-18 DIAGNOSIS — R0789 Other chest pain: Principal | ICD-10-CM | POA: Insufficient documentation

## 2014-01-18 DIAGNOSIS — R0602 Shortness of breath: Secondary | ICD-10-CM | POA: Insufficient documentation

## 2014-01-18 DIAGNOSIS — R600 Localized edema: Secondary | ICD-10-CM

## 2014-01-18 DIAGNOSIS — Z8249 Family history of ischemic heart disease and other diseases of the circulatory system: Secondary | ICD-10-CM | POA: Insufficient documentation

## 2014-01-18 DIAGNOSIS — R609 Edema, unspecified: Secondary | ICD-10-CM | POA: Insufficient documentation

## 2014-01-18 DIAGNOSIS — I2510 Atherosclerotic heart disease of native coronary artery without angina pectoris: Secondary | ICD-10-CM | POA: Insufficient documentation

## 2014-01-18 DIAGNOSIS — J9819 Other pulmonary collapse: Secondary | ICD-10-CM | POA: Insufficient documentation

## 2014-01-18 DIAGNOSIS — E041 Nontoxic single thyroid nodule: Secondary | ICD-10-CM | POA: Insufficient documentation

## 2014-01-18 DIAGNOSIS — I441 Atrioventricular block, second degree: Secondary | ICD-10-CM | POA: Insufficient documentation

## 2014-01-18 DIAGNOSIS — R079 Chest pain, unspecified: Secondary | ICD-10-CM | POA: Diagnosis present

## 2014-01-18 DIAGNOSIS — R0600 Dyspnea, unspecified: Secondary | ICD-10-CM

## 2014-01-18 DIAGNOSIS — Z6841 Body Mass Index (BMI) 40.0 and over, adult: Secondary | ICD-10-CM | POA: Insufficient documentation

## 2014-01-18 DIAGNOSIS — J309 Allergic rhinitis, unspecified: Secondary | ICD-10-CM

## 2014-01-18 DIAGNOSIS — R0989 Other specified symptoms and signs involving the circulatory and respiratory systems: Secondary | ICD-10-CM

## 2014-01-18 DIAGNOSIS — E042 Nontoxic multinodular goiter: Secondary | ICD-10-CM

## 2014-01-18 DIAGNOSIS — R0609 Other forms of dyspnea: Secondary | ICD-10-CM | POA: Insufficient documentation

## 2014-01-18 DIAGNOSIS — E785 Hyperlipidemia, unspecified: Secondary | ICD-10-CM | POA: Insufficient documentation

## 2014-01-18 HISTORY — DX: Other seasonal allergic rhinitis: J30.2

## 2014-01-18 HISTORY — DX: Nontoxic multinodular goiter: E04.2

## 2014-01-18 LAB — CBC AND DIFFERENTIAL
Basophils Absolute Automated: 0.07 10*3/uL (ref 0.00–0.20)
Basophils Automated: 1 %
Eosinophils Absolute Automated: 0.23 10*3/uL (ref 0.00–0.70)
Eosinophils Automated: 4 %
Hematocrit: 36.8 % — ABNORMAL LOW (ref 37.0–47.0)
Hgb: 11.9 g/dL — ABNORMAL LOW (ref 12.0–16.0)
Immature Granulocytes Absolute: 0.01 10*3/uL
Immature Granulocytes: 0 %
Lymphocytes Absolute Automated: 2.67 10*3/uL (ref 0.50–4.40)
Lymphocytes Automated: 46 %
MCH: 28.9 pg (ref 28.0–32.0)
MCHC: 32.3 g/dL (ref 32.0–36.0)
MCV: 89.3 fL (ref 80.0–100.0)
MPV: 9.8 fL (ref 9.4–12.3)
Monocytes Absolute Automated: 0.33 10*3/uL (ref 0.00–1.20)
Monocytes: 6 %
Neutrophils Absolute: 2.55 10*3/uL (ref 1.80–8.10)
Neutrophils: 44 %
Nucleated RBC: 0 /100 WBC (ref 0–1)
Platelets: 258 10*3/uL (ref 140–400)
RBC: 4.12 10*6/uL — ABNORMAL LOW (ref 4.20–5.40)
RDW: 13 % (ref 12–15)
WBC: 5.85 10*3/uL (ref 3.50–10.80)

## 2014-01-18 LAB — B-TYPE NATRIURETIC PEPTIDE: B-Natriuretic Peptide: 37 pg/mL (ref 0–100)

## 2014-01-18 LAB — TROPONIN I
Troponin I: <0.01 ng/mL (ref 0.00–0.09)
Troponin I: <0.01 ng/mL (ref 0.00–0.09)
Troponin I: <0.01 ng/mL (ref 0.00–0.09)

## 2014-01-18 LAB — TSH: TSH: 2.15 u[IU]/mL (ref 0.35–4.94)

## 2014-01-18 LAB — COMPREHENSIVE METABOLIC PANEL
ALT: 8 U/L (ref 0–55)
AST (SGOT): 10 U/L (ref 5–34)
Albumin/Globulin Ratio: 1 (ref 0.9–2.2)
Albumin: 3.6 g/dL (ref 3.5–5.0)
Alkaline Phosphatase: 63 U/L (ref 40–150)
BUN: 10 mg/dL (ref 7–21)
Bilirubin, Total: 0.4 mg/dL (ref 0.2–1.2)
CO2: 24 mEq/L (ref 22–29)
Calcium: 9.1 mg/dL (ref 8.5–10.5)
Chloride: 109 mEq/L — ABNORMAL HIGH (ref 98–107)
Creatinine: 0.7 mg/dL (ref 0.6–1.0)
Globulin: 3.5 g/dL (ref 2.0–3.6)
Glucose: 88 mg/dL (ref 70–100)
Potassium: 4.3 mEq/L (ref 3.5–5.1)
Protein, Total: 7.1 g/dL (ref 6.0–8.3)
Sodium: 141 mEq/L (ref 136–145)

## 2014-01-18 LAB — GFR: EGFR: >60

## 2014-01-18 LAB — IHS D-DIMER: D-Dimer: 0.47 ug/mL FEU (ref 0.00–0.51)

## 2014-01-18 MED ORDER — FAMOTIDINE 20 MG PO TABS
20.0000 mg | ORAL_TABLET | Freq: Two times a day (BID) | ORAL | Status: DC
Start: 2014-01-18 — End: 2014-01-19
  Administered 2014-01-18 – 2014-01-19 (×2): 20 mg via ORAL
  Filled 2014-01-18 (×2): qty 1

## 2014-01-18 MED ORDER — NALOXONE HCL 0.4 MG/ML IJ SOLN
0.2000 mg | INTRAMUSCULAR | Status: DC | PRN
Start: 2014-01-18 — End: 2014-01-19

## 2014-01-18 MED ORDER — ENOXAPARIN SODIUM 40 MG/0.4ML SC SOLN
40.0000 mg | Freq: Every day | SUBCUTANEOUS | Status: DC
Start: 2014-01-18 — End: 2014-01-19
  Administered 2014-01-18 – 2014-01-19 (×2): 40 mg via SUBCUTANEOUS
  Filled 2014-01-18 (×2): qty 0.4

## 2014-01-18 MED ORDER — IBUPROFEN 600 MG PO TABS
600.0000 mg | ORAL_TABLET | Freq: Four times a day (QID) | ORAL | Status: DC | PRN
Start: 2014-01-18 — End: 2014-01-19
  Administered 2014-01-19: 600 mg via ORAL
  Filled 2014-01-18: qty 1

## 2014-01-18 MED ORDER — ASPIRIN 325 MG PO TABS
325.0000 mg | ORAL_TABLET | Freq: Once | ORAL | Status: AC
Start: 2014-01-18 — End: 2014-01-18
  Administered 2014-01-18: 325 mg via ORAL
  Filled 2014-01-18: qty 1

## 2014-01-18 MED ORDER — ONDANSETRON HCL 4 MG/2ML IJ SOLN
4.0000 mg | Freq: Three times a day (TID) | INTRAMUSCULAR | Status: DC | PRN
Start: 2014-01-18 — End: 2014-01-19

## 2014-01-18 MED ORDER — CETIRIZINE HCL 10 MG PO TABS
10.0000 mg | ORAL_TABLET | Freq: Every day | ORAL | Status: DC
Start: 2014-01-18 — End: 2014-01-19
  Administered 2014-01-18 – 2014-01-19 (×2): 10 mg via ORAL
  Filled 2014-01-18 (×2): qty 1

## 2014-01-18 MED ORDER — ACETAMINOPHEN 325 MG PO TABS
975.0000 mg | ORAL_TABLET | Freq: Once | ORAL | Status: AC
Start: 2014-01-18 — End: 2014-01-18
  Administered 2014-01-18: 975 mg via ORAL
  Filled 2014-01-18: qty 3

## 2014-01-18 MED ORDER — ACETAMINOPHEN 325 MG PO TABS
650.0000 mg | ORAL_TABLET | ORAL | Status: DC | PRN
Start: 2014-01-18 — End: 2014-01-19

## 2014-01-18 NOTE — Progress Notes (Signed)
Patient received from the ED with diagnosis chest pain. Observed to be alert and oriented x 4 and stated pain had significantly subsided compared to this morning.  Discussed care plan with patient who was seen by Lawson Fiscal (NP) and Dr Deforest Hoyles (attending providers) on arrival. Education materials also given. Observed with brief 1st degree Mobitz AVB right upon arrival and reported and strip reviewed by Lawson Fiscal. Patient was asymptomatic.

## 2014-01-18 NOTE — H&P (Signed)
ADMISSION HISTORY AND PHYSICAL EXAM    Date Time: 01/18/2014 3:32 PM  Patient Name: Kristina [name]  Attending Physician: Richarda Osmond, MD  Primary Care Physician: Christa See, MD    CC: chest pain       Assessment:   -atypical chest pain  -dyspnea on exertion  -second degree AVB  -+cardiac RF, including strong FH CAD and smoker.   -Allergic rhinitis    Plan:   -Admit IMG hospitalist service.   -Telemetry monitoring  -Serial Troponin I  -Nuclear stress test in AM.  -2D echo in AM  -check lipids, BNP, TFT.   -Zyrtec for Allergic rhinitis'  -H2 blocker.   -VTE prophylaxis with Lovenox.   History of Presenting Illness:   Kristina [name] is [name] 53 y.o. female with no hx CAD was admitted for c/o sudden onset stabbing anterior chest pain with +/- SOB.  Episodes x 3 within one hour each lasting < and occurred while physically active at work. Symptoms resolved spontaneously.     This AM pt awoke with similar but less intense event and came to ED for evaluation.     Pt also reports swelling of leg/feet and feeling more DOE than baseline for 3 months.  Separately she notes episodes of "lightheadedness" 2 x week past 2-3 mos.   Past Medical History:     Past Medical History   Diagnosis Date   . Seasonal allergic rhinitis    . Multiple thyroid nodules      noted 2013 admission       Available old records reviewed, including: [facility] records      Past Surgical History:   History reviewed. No pertinent past surgical history.    Family History:     Family History   Problem Relation Age of Onset   . Heart disease Mother    . Heart disease Sister    . Heart disease Brother    . Kidney disease Father    . Heart disease Father    . Kidney disease Son      deceased    . Heart disease Son        Social History:     History   Smoking status   . Current Every Day Smoker -- 4.0 packs/day for 40 years   . Types: Cigarettes   Smokeless tobacco   . Not on file     History   Alcohol Use No     History   Drug Use No       Allergies:    No Known Allergies    Medications:     Prior to Admission medications    Medication Sig Start Date End Date Taking? Authorizing Provider   aspirin 325 MG EC tablet Take 325 mg by mouth daily.   Yes [provider]       Review of Systems:   Full system review performed with findings as per HPI/PMH and below.     General: negative for -malaise, fever or recent illness.    Respiratory : negative for -  Chronic cough, orthopnea.  Notes DOE last several mos. +PND 2/2 seasonal allergies.   Cardiovascular : negative for - chest pain, palpitations.  Gastrointestinal : negative for - abdominal pain, constipation, diarrhea or nausea/vomiting  Genito-Urinary : negative for -  Dysuria.  Musculoskeletal : negative for - joint pain, joint stiffness or joint swelling  Neurological : negative for - mental status changes, focal weakness, migraines.  +episodes  of transient "lightheadedness" 2x week for past 2-34mos.   Dermatological ROS: negative for pruritus, rash, bruising.         Physical Exam:   Patient Vitals for the past 24 hrs:   BP Temp Pulse Resp SpO2 Height Weight   01/18/14 1500 133/75 mmHg 98 F (36.7 C) 64  20  100 % - -   01/18/14 1400 - - - - - 1.575 m (5\' 2" ) 99.791 kg (220 lb)   01/18/14 1233 134/75 mmHg - 68  18  100 % - -   01/18/14 1104 138/81 mmHg 98.3 F (36.8 C) 79  18  100 % - -     Body mass index is 40.23 kg/(m^2).  No intake or output data in the 24 hours ending 01/18/14 1532  Physical Exam:   General appearance - alert, well appearing, obese female. Appears comfortable.    Mental status - alert, oriented x 4  HEENT- Normocephalic, atraumatic, non-icteric with pink conjunctiva, moist mucous membranes and grossly EOMI intact.  Neck - supple, no masses, lymphadenopathy. Note general neck fullness without discrete masses. No JVD.   Chest - bilateral breath sounds clear to auscultation. Non labored +reproducable anterior CP to palpation lower sternal and mid L chest.   Heart -  regular rhythm,  normal S1, S2, no murmurs.  Abdomen - obese, soft, nontender, wirh normal bowel sounds.  Neurological -  No focal deficets.  Musculoskeletal - no joint tenderness or swelling  Extremities - peripheral pulses normal, no edema evident.   Skin - warm, dry, with no rashes, lesions or bruising.    Telemetry: Note occasional 2:1 AVB and 2nd degree type I.      Labs:     Results     Procedure Component Value Units Date/Time    B-type Natriuretic Peptide [[id number]] Collected:01/18/14 1118    Specimen Information:Blood Updated:01/18/14 1530    TSH [[id number]] Collected:01/18/14 1118    Specimen Information:Blood Updated:01/18/14 1530    D-Dimer [[id number]] Collected:01/18/14 1118     D-Dimer 0.47 ug/mL FEU Updated:01/18/14 1224    Troponin I [[id number]] Collected:01/18/14 1118    Specimen Information:Blood Updated:01/18/14 1151     Troponin I <0.01 ng/mL     Comprehensive Metabolic Panel (CMP) [[id number]]  (Abnormal) Collected:01/18/14 1118    Specimen Information:Blood Updated:01/18/14 1144     Glucose 88 mg/dL      BUN 10 mg/dL      Creatinine 0.7 mg/dL      Sodium 244 mEq/L      Potassium 4.3 mEq/L      Chloride 109 (H) mEq/L      CO2 24 mEq/L      CALCIUM 9.1 mg/dL      Protein, Total 7.1 g/dL      Albumin 3.6 g/dL      AST (SGOT) 10 U/L      ALT 8 U/L      Alkaline Phosphatase 63 U/L      Bilirubin, Total 0.4 mg/dL      Globulin 3.5 g/dL      Albumin/Globulin Ratio 1.0     GFR [[id number]] Collected:01/18/14 1118     EGFR >60.0 Updated:01/18/14 1144    CBC and differential [[id number]]  (Abnormal) Collected:01/18/14 1118    Specimen Information:Blood / Blood Updated:01/18/14 1131     WBC 5.85 x10 3/uL      RBC 4.12 (L) x10 6/uL      Hgb 11.9 (L) g/dL      Hematocrit 59.5 (L) %  MCV 89.3 fL      MCH 28.9 pg      MCHC 32.3 g/dL      RDW 13 %      Platelets 258 x10 3/uL      MPV 9.8 fL      Neutrophils 44 %      Lymphocytes Automated 46 %      Monocytes 6 %      Eosinophils Automated 4 %      Basophils Automated 1 %       Immature Granulocyte 0 %      Nucleated RBC 0 /100 WBC      Neutrophils Absolute 2.55 x10 3/uL      Abs Lymph Automated 2.67 x10 3/uL      Abs Mono Automated 0.33 x10 3/uL      Abs Eos Automated 0.23 x10 3/uL      Absolute Baso Automated 0.07 x10 3/uL      Absolute Immature Granulocyte 0.01 x10 3/uL           Imaging personally reviewed, including:     CXR: No acute infiltrate or venous congestion.   ECG: normal sinus rhythm.     Safety Checklist  DVT prophylaxis:  CHEST guideline (See page [phone]) Chemical   Foley: Not present   IVs:  Peripheral IV   PT/OT: Not needed       Signed by: Lynnell Jude, ANP  cc:Pcp, Noneorunknown, MD

## 2014-01-18 NOTE — Plan of Care (Signed)
Problem: Safety  Goal: Patient will be free from injury during hospitalization  Outcome: Progressing  Discussed disease process, management techniques and offered education materials  Pain needs assessed and duly incorporated  into the care plan. Patient denies pain at this time.    Safety assessment completed, needs discussed and addressed. Offered low bed, call bell and non-skid socks and asked patient to call for assistance            Problem: Pain  Goal: Patient's pain/discomfort is manageable  Outcome: Progressing  Pain needs assessed and duly incorporated  into the care plan. Patient denies pain at this time.                  Problem: Chest Pain  Goal: Vital signs and cardiac rhythm stable  Outcome: Progressing  Patient received from the ED with diagnosis chest pain. Observed to be alert and oriented x 4 and stated pain has subsided.  Discussed care plan with patient who was seen by Lawson Fiscal  (NP). Education materials also given.

## 2014-01-18 NOTE — ED Notes (Signed)
Last night- sharp c\p in center of chest non-radiating- pain int.

## 2014-01-18 NOTE — ED Provider Notes (Signed)
Physician/Midlevel provider first contact with patient: 01/18/14 1104         History     Chief Complaint   Patient presents with   . Chest Pain     Patient is a 53 y.o. female presenting with chest pain. The history is provided by the patient. No language interpreter was used.   Chest Pain  The chest pain began 3 - 5 hours ago. Chest pain occurs intermittently. The chest pain is unchanged. The severity of the pain is severe. The quality of the pain is described as sharp. The pain does not radiate. Chest pain is worsened by deep breathing. Primary symptoms include shortness of breath. Pertinent negatives for primary symptoms include no fever and no cough. She tried nothing for the symptoms. Risk factors include obesity.   Pertinent negatives for past medical history include no CAD and no PE.   Her family medical history is significant for CAD in family.   Procedure history is negative for cardiac catheterization and exercise treadmill test.     sharp left cp today.  No radiation, nausea.  + sob.    Past Medical History   Diagnosis Date   . Multiple thyroid nodules        History reviewed. No pertinent past surgical history.    History reviewed. No pertinent family history.    Social  History   Substance Use Topics   . Smoking status: Current Every Day Smoker -- 4.0 packs/day for 40 years     Types: Cigarettes   . Smokeless tobacco: Not on file   . Alcohol Use: No       .     No Known Allergies    Current/Home Medications    No medications on file        Review of Systems   Constitutional: Negative for fever and chills.   Respiratory: Positive for shortness of breath. Negative for cough.    Cardiovascular: Positive for chest pain.   Neurological: Negative for speech difficulty.   All other systems reviewed and are negative.        Physical Exam    BP: 138/81 mmHg, Heart Rate: 79 , Temp: 98.3 F (36.8 C), Resp Rate: 18 , SpO2: 100 %    Physical Exam  Nursing note and vitals reviewed.  Constitutional:  Well  developed, well nourished. Awake & Oriented x3.  Head:  Atraumatic. Normocephalic.    Eyes:  PERRL. EOMI. Conjunctivae are not pale.  ENT:  Mucous membranes are moist and intact. Oropharynx is clear and symmetric.  Patent airway.  Neck:  Supple. Full ROM.    Cardiovascular:  Regular rate. Regular rhythm. No murmurs, rubs, or gallops.  Pulmonary/Chest:  No evidence of respiratory distress. Clear to auscultation bilaterally.  No wheezing, rales or rhonchi.  Some pain on palpation left chest.  Abdominal:  Soft and non-distended. There is no tenderness. No rebound, guarding, or rigidity.  Back:  Full ROM. Nontender.  Extremities:  No edema. No cyanosis. No clubbing. Full range of motion in all extremities.  Skin:  Skin is warm and dry.  No diaphoresis. No rash.   Neurological:  Alert, awake, and appropriate. Normal speech. Motor normal.  Psychiatric:  Good eye contact. Normal interaction, affect, and behavior.    Px 100 ra normal no tx needed  MDM and ED Course     ED Medication Orders      Start     Status Ordering Provider    01/18/14 [phone]  acetaminophen (TYLENOL) tablet 975 mg   Once in ED      Route: Oral  Ordered Dose: 975 mg         Last MAR action:  Given Yuritzy Zehring C    01/18/14 1208   aspirin tablet 325 mg   Once      Route: Oral  Ordered Dose: 325 mg         Last MAR action:  Given Maryah Marinaro C               Results for orders placed during the hospital encounter of 01/18/14   XR CHEST AP PORTABLE    Narrative:     INDICATION: Pneumonia symptoms  COMPARISON: 08/08/2012    FINDINGS:  A single radiograph of the chest performed. Portable film.  The heart is normal in size.   The mediastinal and hilar structures are within normal limits.  The lung fields demonstrates atelectasis which is minimal at the right  lung base.Marland Kitchen No pleural effusion.  No pneumothorax.    The  visualized osseous structures demonstrates no acute abnormality.      Impression:      Minimal right lower lung subsegmental atelectasis.  No acute  infiltrate    Kinnie Feil, MD   01/18/2014 11:28 AM       Results for orders placed during the hospital encounter of 01/18/14   CBC AND DIFFERENTIAL       Component Value Range    WBC 5.85  3.50 - 10.80 x10 3/uL    RBC 4.12 (*) 4.20 - 5.40 x10 6/uL    Hgb 11.9 (*) 12.0 - 16.0 g/dL    Hematocrit 16.1 (*) 37.0 - 47.0 %    MCV 89.3  80.0 - 100.0 fL    MCH 28.9  28.0 - 32.0 pg    MCHC 32.3  32.0 - 36.0 g/dL    RDW 13  12 - 15 %    Platelets 258  140 - 400 x10 3/uL    MPV 9.8  9.4 - 12.3 fL    Neutrophils 44  None %    Lymphocytes Automated 46  None %    Monocytes 6  None %    Eosinophils Automated 4  None %    Basophils Automated 1  None %    Immature Granulocyte 0  None %    Nucleated RBC 0  0 - 1 /100 WBC    Neutrophils Absolute 2.55  1.80 - 8.10 x10 3/uL    Abs Lymph Automated 2.67  0.50 - 4.40 x10 3/uL    Abs Mono Automated 0.33  0.00 - 1.20 x10 3/uL    Abs Eos Automated 0.23  0.00 - 0.70 x10 3/uL    Absolute Baso Automated 0.07  0.00 - 0.20 x10 3/uL    Absolute Immature Granulocyte 0.01  0 x10 3/uL   COMPREHENSIVE METABOLIC PANEL       Component Value Range    Glucose 88  70 - 100 mg/dL    BUN 10  7 - 21 mg/dL    Creatinine 0.7  0.6 - 1.0 mg/dL    Sodium 096  045 - 409 mEq/L    Potassium 4.3  3.5 - 5.1 mEq/L    Chloride 109 (*) 98 - 107 mEq/L    CO2 24  22 - 29 mEq/L    CALCIUM 9.1  8.5 - 10.5 mg/dL    Protein, Total 7.1  6.0 - 8.3 g/dL  Albumin 3.6  3.5 - 5.0 g/dL    AST (SGOT) 10  5 - 34 U/L    ALT 8  0 - 55 U/L    Alkaline Phosphatase 63  40 - 150 U/L    Bilirubin, Total 0.4  0.2 - 1.2 mg/dL    Globulin 3.5  2.0 - 3.6 g/dL    Albumin/Globulin Ratio 1.0  0.9 - 2.2   TROPONIN I       Component Value Range    Troponin I <0.01  0.00 - 0.09 ng/mL   GFR       Component Value Range    EGFR >60.0     [facility] D-DIMER       Component Value Range    D-Dimer 0.47  0.00 - 0.51 ug/mL FEU       MDM  ekg read by me.  nsr 74.  Lv.  Non specific t wave changes  Monitor nsr 75.    I spoke ro Dr. Deforest Hoyles who will admit to ro  acs.  Procedures    Clinical Impression & Disposition     Clinical Impression  Final diagnoses:   Chest pain        ED Disposition     Observation Admitting Physician: Arna Medici [[id number]]  Diagnosis: Chest pain [[id number]]  Estimated Length of Stay: < 2 midnights  Tentative Discharge Plan?: Home or Self Care [1]  Patient Class: Observation [104]             New Prescriptions    No medications on file                 Kennith Maes, MD  01/18/14 1318

## 2014-01-18 NOTE — Progress Notes (Signed)
Patient received from the ED with diagnosis chest pain. Observed to be alert and oriented x 4 and stated pain has subsided.  Discussed care plan with patient who was seen by Lawson Fiscal  (NP). Education materials also given.

## 2014-01-18 NOTE — H&P (Signed)
I have reviewed the interval history, images and pertinent test results and agree with the preceding NP Peetros' note;     BP 133/75  Pulse 64  Temp 98 F (36.7 C)  Resp 20  Ht 1.575 m (5\' 2" )  Wt 99.791 kg (220 lb)  BMI 40.23 kg/m2  SpO2 100%    Active Hospital Problems    Diagnosis   . Chest pain   . Dyspnea on exertion   . Second degree atrioventricular block, Mobitz (type) I   . Allergic rhinitis   serial trop  Lipid panel and A1C  Echo and stress test in am  Monitor on tele        Richarda Osmond, MD

## 2014-01-19 ENCOUNTER — Observation Stay: Payer: Charity

## 2014-01-19 DIAGNOSIS — R600 Localized edema: Secondary | ICD-10-CM | POA: Diagnosis present

## 2014-01-19 DIAGNOSIS — E785 Hyperlipidemia, unspecified: Secondary | ICD-10-CM

## 2014-01-19 DIAGNOSIS — R609 Edema, unspecified: Secondary | ICD-10-CM

## 2014-01-19 LAB — HEMOLYSIS INDEX: Hemolysis Index: 2 (ref 0–18)

## 2014-01-19 LAB — LIPID PANEL
Cholesterol / HDL Ratio: 5.5
Cholesterol: 166 mg/dL (ref 0–199)
HDL: 30 mg/dL — ABNORMAL LOW (ref >40)
LDL Calculated: 118 mg/dL — ABNORMAL HIGH (ref 0–99)
Triglycerides: 92 mg/dL (ref 34–149)
VLDL Calculated: 18 mg/dL (ref 10–40)

## 2014-01-19 LAB — ECG 12-LEAD
Atrial Rate: 74 {beats}/min
P Axis: 58 degrees
P-R Interval: 166 ms
Q-T Interval: 380 ms
QRS Duration: 78 ms
QTC Calculation (Bezet): 421 ms
R Axis: -14 degrees
T Axis: 4 degrees
Ventricular Rate: 74 {beats}/min

## 2014-01-19 LAB — HEMOGLOBIN A1C: Hemoglobin A1C: 5.8 % (ref 0.0–6.0)

## 2014-01-19 MED ORDER — SIMVASTATIN 10 MG PO TABS
10.0000 mg | ORAL_TABLET | Freq: Every day | ORAL | Status: DC
Start: 2014-01-19 — End: 2014-01-19

## 2014-01-19 MED ORDER — CETIRIZINE HCL 10 MG PO TABS
10.0000 mg | ORAL_TABLET | Freq: Every day | ORAL | Status: DC
Start: 2014-01-19 — End: 2022-07-27

## 2014-01-19 MED ORDER — THALLOUS CHLORIDE TL 201 1 MCI/ML IV SOLN
3.0000 | Freq: Once | INTRAVENOUS | Status: AC
Start: 2014-01-19 — End: 2014-01-19
  Administered 2014-01-19: 3 via INTRAVENOUS

## 2014-01-19 MED ORDER — REGADENOSON 0.4 MG/5ML IV SOLN
INTRAVENOUS | Status: AC
Start: 2014-01-19 — End: 2014-01-19
  Administered 2014-01-19: 0.4 mg via INTRAVENOUS
  Filled 2014-01-19: qty 5

## 2014-01-19 MED ORDER — SIMVASTATIN 10 MG PO TABS
10.0000 mg | ORAL_TABLET | Freq: Every day | ORAL | Status: DC
Start: 2014-01-19 — End: 2015-01-17

## 2014-01-19 MED ORDER — FUROSEMIDE 20 MG PO TABS
20.0000 mg | ORAL_TABLET | Freq: Every day | ORAL | Status: DC | PRN
Start: 2014-01-19 — End: 2015-01-17

## 2014-01-19 MED ORDER — TECHNETIUM TC 99M TETROFOSMIN INJECTION
1.0000 | Freq: Once | Status: AC
Start: 2014-01-19 — End: 2014-01-19
  Administered 2014-01-19: 1 via INTRAVENOUS

## 2014-01-19 NOTE — Discharge Instructions (Signed)
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 (These are called "allergens"). When pollen causes a nasal allergy it is commonly called "Hay Fever".  When these particles contact the lining of the nose, eyes, eyelids, sinuses or throat, they cause the cells to release a chemical called "histamine". Histamine may cause a watery discharge from the eyes or nose. It may also cause violent sneezing, nasal congestion, itching of the eyes, nose, throat and mouth.  Prevention:  Nasal allergy cannot be cured but symptoms can be reduced. Avoid or reduce exposure to the allergen when possible, by the following measures:  POLLEN   Stay indoors on hot windy days during pollen season   Keep windows and doors closed   Use an air conditioner with an electrostatic filter  DUST, MOLD & MILDEW  Follow these measures, especially in the bedroom:   When cleaning use vacuum cleaners,  oiled mops and damp cloths; don't stir up the dust.   Once a week clean the walls, woodwork and floors with a damp mop and vacuum carpets.   Once a year clean the bed frame and springs (do this outside).   Cover the box springs with plastic. Do not use mattress pads.   Remove stuffed chairs and rugs from the bedroom.   Discard old moldy books, furniture and bedding.   Use synthetic fabrics for furniture, curtains and bedding. Avoid quilts, comforters, and stuffed toys.  ANIMAL DANDER   Remove all indoor pets (except fish and reptiles).   Avoid all contact with furry animals.   Avoid down-stuffed pillows and coats.   Some persons are also sensitive to wool and should avoid it.  OTHER IRRITANTS   Do not smoke and avoid the smoke of others.   Some persons are sensitive to cosmetic powder, baby powder and powdered laundry detergents. Therefore, these powders should be avoided.  Home Care:   DECONGESTANT pills and sprays (Sudafed, NeoSynephrine, Afrin), reduce tissue swelling and watery discharge. Overuse of nasal decongestant sprays may make symptoms worse. Do not use these more often than recommended.   ANTIHISTAMINES block the release of histamine during the allergic response. Antihistamines are more effective when taken BEFORE symptoms develop. Unless a prescription antihistamine was prescribed, you may take CLARITIN (loratadine). (Claritin is an over-the-counter antihistamine that does not cause drowsiness.)   STEROID nasal sprays (Beconase,  Len Childs) or oral steroids (Prednisone) may also be prescribed for more severe symptoms. These help to reduce the local inflammation which adds to the allergic response.   If you have ASTHMA, pollen season may make your asthma symptoms worse. It is important that you use your asthma medicines as directed during this time to prevent or treat attacks. Some persons with asthma have a worsening of their asthma symptoms when taking antihistamines. If you  notice this, stop the antihistamines and notify your doctor.  Follow Up  with your doctor or as directed by our staff if your symptoms are not improving with the treatment advised.  Get Prompt Medical Attention  if any of the following occur:   Facial or sinus pain or colored drainage from the nose   Severe headache or ear pain   Fever of 100.36F (38C) or higher, or as directed by your healthcare provider   Wheezing or trouble breathing (If you already know you have asthma, return if your asthma symptoms do not respond to the usual doses of your medicine)   Cough with lots of colored sputum (mucus)   [address] [place], [place] [address]. All rights reserved. This information is not intended as a substitute for professional medical care. Always follow your healthcare professional's instructions.      Nasal Allergy  Nasal Allergy, also called "Allergic Rhinitis" occurs after exposure to pollen, molds, mildew, animal "dander" (scales from animal skin, hair and feathers), dust, smoke and fumes. (These are called "allergens"). When pollen causes a nasal allergy it is commonly called "Hay Fever".  When these particles contact the lining of the nose, eyes, eyelids, sinuses or throat, they cause the cells to release a chemical called "histamine". Histamine may cause a watery discharge from the eyes or nose. It may also cause violent sneezing, nasal congestion, itching of the eyes, nose, throat and mouth.  Prevention:  Nasal allergy cannot be cured but symptoms can be reduced. Avoid or reduce exposure to the allergen when possible, by the following measures:  POLLEN   Stay indoors on hot windy days during pollen season   Keep windows and doors closed   Use an air conditioner with an electrostatic filter  DUST, MOLD & MILDEW  Follow these measures, especially in the bedroom:   When cleaning use vacuum cleaners, oiled mops and damp cloths; don't stir up the dust.   Once a week clean  the walls, woodwork and floors with a damp mop and vacuum carpets.   Once a year clean the bed frame and springs (do this outside).   Cover the box springs with plastic. Do not use mattress pads.   Remove stuffed chairs and rugs from the bedroom.   Discard old moldy books, furniture and bedding.   Use synthetic fabrics for furniture, curtains and bedding. Avoid quilts, comforters, and stuffed toys.  ANIMAL DANDER   Remove all indoor pets (except fish and reptiles).   Avoid all contact with furry animals.   Avoid down-stuffed pillows and coats.   Some persons are also sensitive to wool and should avoid it.  OTHER IRRITANTS   Do not smoke and avoid the smoke of others.   Some persons are sensitive to cosmetic powder, baby powder and powdered laundry detergents. Therefore, these powders should be avoided.  Home Care:   DECONGESTANT pills and sprays (Sudafed, NeoSynephrine, Afrin), reduce tissue swelling and watery discharge. Overuse of nasal decongestant sprays may make symptoms worse. Do not use  these more often than recommended.   ANTIHISTAMINES block the release of histamine during the allergic response. Antihistamines are more effective when taken BEFORE symptoms develop. Unless a prescription antihistamine was prescribed, you may take CLARITIN (loratadine). (Claritin is an over-the-counter antihistamine that does not cause drowsiness.)   STEROID nasal sprays (Beconase, Vancenase, Nasalide) or oral steroids (Prednisone) may also be prescribed for more severe symptoms. These help to reduce the local inflammation which adds to the allergic response.   If you have ASTHMA, pollen season may make your asthma symptoms worse. It is important that you use your asthma medicines as directed during this time to prevent or treat attacks. Some persons with asthma have a worsening of their asthma symptoms when taking antihistamines. If you notice this, stop the antihistamines and notify your doctor.  Follow  Up  with your doctor or as directed by our staff if your symptoms are not improving with the treatment advised.  Get Prompt Medical Attention  if any of the following occur:   Facial or sinus pain or colored drainage from the nose   Severe headache or ear pain   Fever of 100.51F (38C) or higher, or as directed by your healthcare provider   Wheezing or trouble breathing (If you already know you have asthma, return if your asthma symptoms do not respond to the usual doses of your medicine)   Cough with lots of colored sputum (mucus)   [address], [place], [place] [address]. All rights reserved. This information is not intended as a substitute for professional medical care. Always follow your healthcare professional's instructions.

## 2014-01-19 NOTE — Progress Notes (Signed)
Severe Sepsis Screen    Date: 01/19/2014 Time: 9:18 AM  Nurse Signature: Barbette Or    Exclusions:      Patients meeting the following criteria are excluded from screening:     []  Suspicion or diagnosis of sepsis is documented and until 72 hours after antibiotics started or last regimen change:   - If Yes, Date of Documented Sepsis:                                          - If Yes, Date of last change in antibiotics:                                           []  Surgery - No screening for 24 hours after surgery   - If Yes, Date of Surgery:                                           []  Arctic Sun hypothermia protocol- Resume screening when arctic sun complete   []  Comfort Care Orders- Do not resume screening    Did you check any of the boxes above?     [x]  No, Continue to section A   []  Yes, Stop Here, Patient Excluded from Sepsis Screening. If screening should resume in the future, place "sticky note to treatment team" with date/time of when screening should resume. Communicate patient excluded from screening due to Comfort Care Orders using "sticky note to treatment team."    A. Infection:      Does your patient have ONE or more of the following infection criteria?     []  Documented Infection - Does the patient have positive culture results (from blood, sputum, urine, etc)?   []  Anti-Infective Therapy - Is the patient receiving antibiotic, antifungal, or other anti-infective therapy?   []  Pneumonia - Is there documentation of pneumonia (X Ray, etc)?   []  WBC's - Have WBC's been found in normally sterile fluid (urine, CSF, etc.)?   []  Perforated Viscus - Does the patient have a perforated hollow organ (bowel)?    A.  Did you check any of the boxes above?     [x]  No, Stop Here and Sepsis Screen Negative   []  Yes, continue to section B    B. SIRS:      Does your patient have TWO or more of the following SIRS criteria?     []  Temperature - Is the patient's temperature: Temp: 97.7 F (36.5 C) (01/19/14  0755)   - Greater than or equal to 38.3 degrees C (greater than 100.9 degrees F)?   - Less than or equal to 36 degrees C (less than or equal to 96.8 degrees F)?     []  Heart Rate: Heart Rate: 63  (01/19/14 0436)   - Is the patient's heart rate greater than or equal to 90 bpm?     []  Respiratory: Resp Rate: 18  (01/19/14 0755)   - Is the patient's respiratory rate greater than 20?     []  WBC Count - Is the patient's WBC count:   Lab 01/18/14 1118   WBC 5.85       - Greater than  or equal to 12,000/mm3 OR   - Less than or equal to 4,000/mm3 OR    - Are there greater than 10% immature neutrophils (bands)?     []  Glucose >140 without diabetes or on steroids?   Lab 01/18/14 1118   GLU 88         []  Significant edema is present?    B.  Did you check two or more of the boxes above?     []  No, Stop Here and Sepsis Screen Negative   []  Yes, contact Charge Nurse, continue to section C    C. ACUTE Organ Dysfunction:      Does your patient have ONE or more of the following organ dysfunction? (May need to wait for lab results for assessment - see below) Organ dysfunction must be a result of the sepsis NOT CHRONIC conditions.     []  Cardiovascular - Does the patient have a: BP: 111/61 mmHg (01/19/14 0755)   - Systolic Blood Pressure less than or equal to 90 mmHg OR   - Systolic Blood Pressure has dropped 40 mmHg or more from baseline OR   - Mean Arterial Pressure less than or equal to 70 mmHg (for at least one hour despite fluid resuscitation OR   - require vasopressor support?     []  Respiratory - Does the patient have new hypoxia defined by any of the following?   - A sustained increase in oxygen requirements by at least 2L/min on NC or 28% FiO2 within the last 24 hrs OR   - A persistent decrease in oxygen saturation of greater than or equal to 5% lasting at least four or more hours and occurring within the last 24 hours     []  Renal - Does the patient have:   - low urine output (e.g. Less than 0.5 mL/kg/HR for one  hour despite adequate fluid resuscitation OR   - Increased creatinine (greater than 50% increase from baseline) OR   - require acute dialysis?     []  Hematologic - Does the patient have:   - Low platelet count (less than 100,000 mm3)   Lab 01/18/14 1118   PLT 258    OR   - INR/aPTT greater than upper limit of normal? No results found for this basename: INR:1 in the last 168 hours OR No results found for this basename: APTT:1 in the last 168 hours     []  Metabolic - Does the patient have a high lactate (plasma lactate greater than or equal to 2.4 mMol/L? No results found for this basename: LACTATE:5 in the last 168 hours     []  Hepatic - Are the patient's liver enzymes elevated (ALT greater than 72 IU/L or Total Bilirubin greater than 2 MG/dL)?   Lab 01/18/14 1118   BILITOTAL 0.4   ALT 8         []  CNS - Does the patient have altered consciousness or reduced Glasgow Coma Scale?     Other Active Diagnoses that may be contributing to signs of end organ dysfunction (Ex. Chronic kidney disease, cirrhosis):   - ________________________________________________________________      C.  Did you check any of the boxes above?     []  No, Sepsis Screen Negative   []  YES:  A) Infection + B) SIRS + C) Acute Organ Dysfunction = Positive Screen for Severe Sepsis. Notify attending (house officer during off-hours).     Notify Attending/House Technical sales engineer and document in Complex Assessment under provider notification   -  Name of physician notified:                                           - Date/Time Notifiied:                                             Document actions:   _0  Lactate drawn   _1  Blood Cultures obtained   _2  Antibiotics initiated or modified   _3   IV Fluid administered 0.9% NS __________ mLs given   Nursing Comments/Narrative:    - ______________________________________________________________     The Surviving Sepsis Guidelines recommend the following interventions to be completed within one hour of a  positive sepsis screen.   Obtain new blood cultures prior to antibiotic administration if not done within the last 24 hours.   Obtain lactate level, if initial lactate > 58mol, repeat lactate in 2 hours for goal decrease 10-20%; If there is not a decrease call [facility], hospital  If SBP < 90 or MAP < 65 or lactate greater than 4 mmol/dl; Start 0.9% NS IV Fluid Bolus of 30 mL/Kg (minimum) to maintain MAP > 65    Initiate vasopressors for hypotension not responding to fluid resuscitation (1st line Norepinephrine 1 - 300 mcg/min IV) - Patient must be in CCU if requires vasopressor   Initiate or escalate antibiotic therapy   Goal urine output greater than/equal to 0.5 mL/kg/hr

## 2014-01-19 NOTE — Plan of Care (Addendum)
Problem: Chest Pain  Goal: Vital signs and cardiac rhythm stable  Outcome: Progressing   VSS thus far in shift.     Goal: Pain at/below Pt Goal: Patient comfortable  Outcome: Completed Date Met:  01/19/14   No c/o pain thus far in shift.     Goal: Patient/Patient Care Companion demonstrates understanding of disease process, treatment plan, medications, and discharge plan  Outcome: Progressing   Pt aware of plan of care in the AM:   1. NPO after midnight for stress test.   2. Echo in AM.

## 2014-01-19 NOTE — Discharge Summary (Signed)
DISCHARGE SUMMARY/PROGRESS NOTE      Date Time: 01/19/2014 2:43 PM  Patient Name: [name],[name]  Attending Physician: Richarda Osmond, MD  Primary Care Physician: Christa See, MD    Date of Admission:   01/18/2014    Date of Discharge:   01/19/14  Admitting Diagnosis:     Active Hospital Problems    Diagnosis POA   . Hyperlipidemia Yes   . Morbid obesity Yes   . Bilateral lower extremity edema Yes   . Second degree atrioventricular block, Mobitz (type) I Yes   . Allergic rhinitis Yes   . Multiple thyroid nodules Yes      Resolved Hospital Problems    Diagnosis POA   . Chest pain Yes   . Dyspnea on exertion Yes       Discharge Dx:   Principal Diagnosis: <principal problem not specified>  Active Hospital Problems    Diagnosis   . Hyperlipidemia   . Morbid obesity   . Bilateral lower extremity edema   . Second degree atrioventricular block, Mobitz (type) I   . Allergic rhinitis   . Multiple thyroid nodules   -CP and dyspnea: resolved  -possible sleep apnea    Consultations:   Treatment Team: Attending Provider: Richarda Osmond, MD; Registered Nurse: Barbette Or, [facility] Course:   Reason for admission / HPI/hospital course: See admission H&P for full details.    Kristina [name] is a 53 y.o. female with no known hx of CAD was admitted for c/o sudden onset stabbing anterior chest pain with +/- SOB. Episodes x 3 within one hour each lasting < and occurred while physically active at work. Symptoms resolved spontaneously.  ACS was ruled out, patient has stress test and Echo which were normal. She stayed CP free during hospitalization. Her LDL was 119 and because of strong family hx of heart disease she was started on low dose statin. Her A1C was normal. She was noted to have second degree AVF type 1 on tele, but she is asymptomatic. This is likely 2/2 her underlying sleep apnea 9not diagnosed yet,but she is obese and snores). She is referred to a pulmonologist for sleep study.    In 2013 she was found to have  thyroid nodules and she was asked to have follow up as out pt, her TSH is normal, she is being referred to transitional care clinic for follow up.    She works in [employer] and she most likely has medical insurance,but she states she is not sure and she will check with her company. If she has insurance she is given Dr.Ali Kamran's number for PCP. If not she will follow up with Transitional care clinic as stated above.    She has chronic LE edema, and she was given 20 mg po lasix daily prn.    She is stable for discharge home today.      Tests done during this admission:     ECHO: 1. Normal biventricular size and systolic function with an estimated EF of 60-65%. 2. Normal diastolic function. 3. No significant valvular disease. 4. Borderline atrial septal aneurysm with no evidence of shunting.     Nuclear stress test: normal    CXR: Minimal right lower lung subsegmental atelectasis. No acute infiltrate     EKG: normal sinus at 74    Tele: SR with occasional second degree AVB type I        Patient condition at discharge:improved,stable  DISCHARGE DAY  EXAM:  Today:  BP 131/72  Pulse 63  Temp 96.1 F (35.6 C) (Oral)  Resp 18  Ht 1.575 m (5\' 2" )  Wt 99.791 kg (220 lb)  BMI 40.23 kg/m2  SpO2 100%  Ranges for the last 24 hours:  Temp:  [96.1 F (35.6 C)-98 F (36.7 C)] 96.1 F (35.6 C)  Heart Rate:  [63-65] 63   Resp Rate:  [18-20] 18   BP: (111-143)/(61-85) 131/72 mmHg  Body mass index is 40.23 kg/(m^2).    Labs/Procedures/Radiology performed:       Lab 01/18/14 1118   WBC 5.85   HGB 11.9*   HCT 36.8*   PLT 258         Lab 01/18/14 1118   NA 141   K 4.3   CL 109*   CO2 24   BUN 10   CREAT 0.7   EGFR >60.0   GLU 88   CA 9.1      Lab 01/18/14 1118   ALKPHOS 63   BILITOTAL 0.4   BILIDIRECT --   PROT 7.1   ALB 3.6   ALT 8   AST 10          Lab 01/18/14 1118   TSH 2.15   FREET3 --                      Lab 01/19/14 0459   CHOL 166   TRIG 92   HDL 30*   LDL 118*          Discharge Medications:        Medication  List       As of 01/19/2014  2:43 PM      START taking these medications           cetirizine 10 MG tablet    Commonly known as: ZyrTEC    Take 1 tablet (10 mg total) by mouth daily.        furosemide 20 MG tablet    Commonly known as: LASIX    Take 1 tablet (20 mg total) by mouth daily as needed.        simvastatin 10 MG tablet    Commonly known as: ZOCOR    Take 1 tablet (10 mg total) by mouth daily with dinner.         CONTINUE taking these medications           aspirin 325 MG EC tablet               Where to get your medications     These are the prescriptions that you need to pick up.    You may get these medications from any pharmacy.           cetirizine 10 MG tablet    furosemide 20 MG tablet    simvastatin 10 MG tablet                   Discharge Instructions:   Disposition: home  Discharge Diet: cardiac diet          Patient was instructed to follow up with:   Transitional care clinic: call and make an appointment  If she had insurance after confirming with her company they Dr.Kamran and Dr.Hoffman    Minutes spent coordinating discharge and reviewing discharge plan: 30 minutes      Signed by: Richarda Osmond, MD    CC: Christa See, MD

## 2014-01-19 NOTE — Discharge Barriers (Signed)
Patient  Alert and oriented  and being discharged home this evening. Teaching covering medications and side effects completed. Also discussed  follow-up needs with community  providers. Discharge instruction for chest pain completed. Patient verbalized complete understanding of teaching done and education handout given.  IV access and telemetry discontinued before patient was transported to lobby.

## 2014-02-20 ENCOUNTER — Ambulatory Visit (INDEPENDENT_AMBULATORY_CARE_PROVIDER_SITE_OTHER): Payer: Self-pay

## 2014-11-29 ENCOUNTER — Emergency Department
Admission: EM | Admit: 2014-11-29 | Discharge: 2014-11-29 | Disposition: A | Discharge status: Home / Self Care | Payer: Charity | Attending: Emergency Medicine | Admitting: Emergency Medicine

## 2014-11-29 ENCOUNTER — Emergency Department: Payer: Self-pay

## 2014-11-29 DIAGNOSIS — M25562 Pain in left knee: Secondary | ICD-10-CM | POA: Insufficient documentation

## 2014-11-29 DIAGNOSIS — F17210 Nicotine dependence, cigarettes, uncomplicated: Secondary | ICD-10-CM | POA: Insufficient documentation

## 2014-11-29 DIAGNOSIS — J3489 Other specified disorders of nose and nasal sinuses: Secondary | ICD-10-CM | POA: Insufficient documentation

## 2014-11-29 DIAGNOSIS — M25561 Pain in right knee: Secondary | ICD-10-CM

## 2014-11-29 DIAGNOSIS — Z7982 Long term (current) use of aspirin: Secondary | ICD-10-CM | POA: Insufficient documentation

## 2014-11-29 MED ORDER — HYDROCODONE-ACETAMINOPHEN 5-325 MG PO TABS
2.0000 | ORAL_TABLET | Freq: Once | ORAL | Status: DC
Start: 2014-11-29 — End: 2014-11-29
  Filled 2014-11-29: qty 2

## 2014-11-29 MED ORDER — HYDROCODONE-ACETAMINOPHEN 5-325 MG PO TABS
2.0000 | ORAL_TABLET | Freq: Four times a day (QID) | ORAL | Status: DC | PRN
Start: 2014-11-29 — End: 2015-01-17

## 2014-11-29 MED ORDER — IBUPROFEN 600 MG PO TABS
600.0000 mg | ORAL_TABLET | Freq: Once | ORAL | Status: AC
Start: 2014-11-29 — End: 2014-11-29
  Administered 2014-11-29: 600 mg via ORAL
  Filled 2014-11-29: qty 1

## 2014-11-29 MED ORDER — IBUPROFEN 800 MG PO TABS
800.0000 mg | ORAL_TABLET | Freq: Three times a day (TID) | ORAL | Status: DC | PRN
Start: 2014-11-29 — End: 2015-01-17

## 2014-11-29 NOTE — ED Notes (Signed)
Bilateral leg swelling and pain, difficulty walking for a month

## 2014-11-29 NOTE — Discharge Instructions (Signed)
YOU MAY SEE THE DOCTOR LISTED IF YOUR DOCTOR IS NOT AVAILABLE FOR FOLLOW UP IN THE TIME FRAME PROVIDED.  RETURN TO THE EMERGENCY DEPARTMENT IMMEDIATELY FOR ANY WORSENING OR CONCERNS.      uSE OVER THE COUNTER MUCINEX FOR 3 DAYS AS DIRECTED ON PACKAGING.    Osteoarthritis     You were diagnosed with osteoarthritis.    Osteoarthritis (OA) is a type of problem with the joints where they start to suffer from wear and tear, as you get older. It is also called "degenerative arthritis" or "degenerative joint disease."    Normally, there is a tough, rubbery layer of cartilage on the ends of each of the bones that makes up a joint. The cartilage is there to protect the bone and to provide a cushioned, slippery surface. This way, the bones can move smoothly when the joint is bending. When the cartilage gets worn, the bone ends are not as well protected. As the exposed bones rub against each other when the joint bends, it causes irritation, pain and swelling. Any joint can be affected. However, OA is most common in the spine, hands, hips, knees, and feet.     OA is more common as a person gets older. Aging itself does not cause OA. However, changes in the body that happen when one gets older make the chance of getting OA more likely.     Other things that can speed up the development of OA are:     Repetitive activity that involves constantly stressing the joints.   Being overweight. This puts more wear and tear on the joints.   Family history of OA. OA tends to run in families.   Fractures that extend into the joint and cause injury to the cartilage surfaces.     Symptoms of OA can include:   Joint pain. This is usually made worse with heavy activity and repetitive bending.   Stiffness in the joint when bending and moving the joint. This is usually worse in the morning. It gets better with activity as the joint "warms up."   Swelling. Joint swelling can happen when fluid seeps into the joint from chronic  irritation.   Crackling and popping noises when the joint is moved.   Lumps of enlarged bone that can develop on the fingers.    There is no cure for OA. The treatment of OA is designed to control pain and slow down the wear and tear process. Some things that are used to treat OA include:     Pain medications: Acetaminophen (Tylenol) and medications called NSAIDS like ibuprofen (Advil/Motrin) are often used first for pain control.     Steroid injections: This can cause temporary relief of pain. However, repeated injections into the joints can lead to more joint problems.     Life style changes: This includes weight loss and getting involved in exercise programs to help strengthen muscles around painful joints without making the wear and tear worse.     Physical therapy (PT): This may help to build strength and decrease joint stiffness.     In more severe cases, joint replacement surgery may be an option if symptoms can't be controlled using conventional measures.    There does NOT seem to be any evidence that "alternative treatments" like vitamins (A, E, and C), glucosamine, ginger, chondroitin sulfate and others have any helpful effects. ALWAYS talk to your doctor if you are going to try any alternative treatments.     Take all  prescribed medications as directed. Unless you are told something different by your doctors, you can take over-the-counter pain medicines. These medicines can include ibuprofen (Advil/Motrin) or acetaminophen (Tylenol). Follow the directions on the package.    Get involved in any physical therapy recommended by your doctor.    YOU SHOULD SEEK MEDICAL ATTENTION IMMEDIATELY, EITHER HERE OR AT THE NEAREST EMERGENCY DEPARTMENT, IF ANY OF THE FOLLOWING OCCUR:   You have any of the above symptoms and are unable to see or contact your doctor.   You get a fever (temperature higher than 100.35F or 38C) that is associated with pain or redness over your affected joint.   You  have severe swelling in your painful joint.    If you can t follow up with your doctor, or if at any time you feel you need to be rechecked or seen again, come back here or go to the nearest emergency department.      Decongestant Use (EDU)    A decongestant is a medicine that helps to dry up a runny nose. It relieves the stuffy feeling that usually goes along with a cold. It is also used to treat congestion caused by allergies, hay fever, or sinus irritation. Decongestants work by shrinking the blood vessels in your nose for a few hours. There are two main types of decongestants: Oral medications (the kind you swallow) and nasal sprays.    Pseudoephedrine is a common oral decongestant that you can buy over-the-counter. Sudafed is a well-known brand of pseudoephedrine.    Phenylephrine is another common oral decongestant that you can buy over the counter. Sudafed PE is a well-known brand of oral phenylephrine.    Oral decongestants have some side-effects. They can make you dizzy or sleepy. Some people may become agitated or restless. Be careful when driving until you know how the medicine affects you. Because the medicine moves around your blood stream, it can constrict blood vessels other than the ones in your nose. This can cause your blood pressure to go up. Talk to your doctor before taking decongestants if you have any of the following:    High blood pressure.   Heart disease.   Problems urinating or an enlarged prostate.   Diabetes.   Thyroid problems.   Glaucoma.    If you are pregnant or breast-feeding, talk to your OB/GYN before taking any over-the-counter medicines. The [employer] ([employer]OG) recommends that pregnant women do not take pseudoephedrine or phenylephrine during the first trimester (weeks one to 13). These medications are generally considered safe later in pregnancy.    The American Academy of Pediatrics and the Working Group on Human Lactation  say it is safe to use pseudoephedrine occasionally while breastfeeding. Phenylephrine is safe to use while breastfeeding.    If you try a decongestant medicine and your congestion does not get better, talk to your regular doctor or a doctor who specializes in ear, nose, and throat disorders.

## 2014-11-29 NOTE — ED Notes (Signed)
Bed: M 17  Expected date:   Expected time:   Means of arrival:   Comments:

## 2014-11-30 NOTE — ED Provider Notes (Signed)
Physician/Midlevel provider first contact with patient: 11/29/14 6433         History     Chief Complaint   Patient presents with   . Leg Pain     HPI   Patient states that she has had bilateral anterior knee pain over the last several months, was worse today when she woke up in the morning.  Pt answers that there are no fevers, chills, nausea, vomiting, diarrhea, constipation, trauma or rash.  No chest pain or shortness of breath.  No popliteal pain.  No leg swelling.            Bilateral anterior knee pain  Constitutional:  Denies fever or chills   Eyes:  Denies change in visual acuity or eye pain  HENT:  Denies sore throat, trouble swallowing   Respiratory:  Denies cough or shortness of breath or wheeze   Cardiovascular:  Denies chest pain or edema   GI:  Denies abdominal pain, nausea, vomiting, bloody stools or diarrhea  GU:  Denies dysuria, hematuria, frequency or trouble urinating   Musculoskeletal:  Denies other joint pain  Integument:  Denies rash  or wound  Neurologic:  Denies headache, focal weakness, numbness, tingling, speech, vision or gait changes  Psychiatric:  Denies suicidal or homicidal ideation.    Morbid obesity.  Neurovascularly intact with normal knee exam, normal range of motion  Constitutional:  Well developed, well nourished, no acute distress, not ill appearing   Eyes:   conjunctiva normal, no discharge or redness  HENT:  Atraumatic, external ears normal, nose normal, oropharynx moist, no pharyngeal exudates. Neck- normal range of motion, no tenderness, supple   Respiratory:  No respiratory distress, normal breath sounds, no rales, no wheezing, no rhonchi  Cardiovascular:  Normal rate, normal rhythm, no murmurs, no gallops, no rubs, normal radial pulses and dpp   GI:  Soft, nondistended, nontender, no organomegaly, no mass, no rebound, no guarding   GU:  No costovertebral angle tenderness   Musculoskeletal:  No edema, no tenderness, no deformities. Back- no tenderness  Integument:  Well  hydrated, no rash   Lymphatic:  No prominent cervical LAD  Neurologic:  Alert & oriented x 3, normal motor function, no focal deficits noted, coordination normal   Psychiatric:  Speech and behavior appropriate       Diagnosis management comments: I, Ferdinand Cava, MD, have been the primary provider for this patient during this Emergency Dept visit.    Oxygen saturation by pulse oximetry is 95%-100%, Normal, none needed.    DDx:  Very likely osteoarthritis.  Not consistent with cellulitis, deep vein thrombosis, joint infection or fracture         Had a long discussion with patient about how to manage osteoarthritis and her weight      Patient will appearing upon discharge      Patient Progress  Patient progress: stable        Past Medical History   Diagnosis Date   . Seasonal allergic rhinitis    . Multiple thyroid nodules      noted 2013 admission       History reviewed. No pertinent past surgical history.    Family History   Problem Relation Age of Onset   . Heart disease Mother    . Heart disease Sister    . Heart disease Brother    . Kidney disease Father    . Heart disease Father    . Kidney disease Son  deceased    . Heart disease Son        Social  History   Substance Use Topics   . Smoking status: Current Every Day Smoker -- 4.00 packs/day for 40 years     Types: Cigarettes   . Smokeless tobacco: Not on file   . Alcohol Use: No       .     No Known Allergies    Discharge Medication List as of 11/29/2014  9:34 AM      CONTINUE these medications which have NOT CHANGED    Details   aspirin 325 MG EC tablet Take 325 mg by mouth daily., Until Discontinued, Historical Med      cetirizine (ZYRTEC) 10 MG tablet Take 1 tablet (10 mg total) by mouth daily., Starting 01/19/2014, Until Discontinued, Print      furosemide (LASIX) 20 MG tablet Take 1 tablet (20 mg total) by mouth daily as needed., Starting 01/19/2014, Until Discontinued, Print      simvastatin (ZOCOR) 10 MG tablet Take 1 tablet (10 mg total) by mouth  daily with dinner., Starting 01/19/2014, Until Discontinued, Print              Review of Systems    Physical Exam    BP: 132/83 mmHg, Heart Rate: 91, Resp Rate: 20, SpO2: 100 %, Weight: 95.255 kg    Physical Exam      MDM and ED Course     ED Medication Orders     Start     Status Ordering Provider    11/29/14 0932  ibuprofen (ADVIL,MOTRIN) tablet 600 mg   Once     Route: Oral  Ordered Dose: 600 mg     Last MAR action:  Given Ferdinand Cava    11/29/14 0932     Once,   Status:  Discontinued     Route: Oral  Ordered Dose: 2 tablet     Discontinued Tanishia Lemaster JAMES              MDM  Number of Diagnoses or Management Options  Bilateral knee pain:   Rhinorrhea:   Risk of Complications, Morbidity, and/or Mortality  Presenting problems: low  Diagnostic procedures: low  Management options: low    Patient Progress  Patient progress: stable         Procedures    Clinical Impression & Disposition     Clinical Impression  Final diagnoses:   Bilateral knee pain   Rhinorrhea        ED Disposition     Discharge [facility] Smoker [facility] discharge to home/self care.    Condition at disposition: Stable             Discharge Medication List as of 11/29/2014  9:34 AM      START taking these medications    Details   HYDROcodone-acetaminophen (NORCO) 5-325 MG per tablet Take 2 tablets by mouth every 6 (six) hours as needed for Pain., Starting 11/29/2014, Until Discontinued, Print      ibuprofen (ADVIL,MOTRIN) 800 MG tablet Take 1 tablet (800 mg total) by mouth every 8 (eight) hours as needed for Pain., Starting 11/29/2014, Until Discontinued, Print                         Ferdinand Cava, MD  11/30/14 [phone]

## 2015-01-17 ENCOUNTER — Emergency Department: Payer: Charity

## 2015-01-17 ENCOUNTER — Emergency Department: Payer: Self-pay

## 2015-01-17 ENCOUNTER — Emergency Department
Admission: EM | Admit: 2015-01-17 | Discharge: 2015-01-17 | Disposition: A | Discharge status: Home / Self Care | Payer: Charity | Attending: Emergency Medicine | Admitting: Emergency Medicine

## 2015-01-17 DIAGNOSIS — M170 Bilateral primary osteoarthritis of knee: Secondary | ICD-10-CM | POA: Insufficient documentation

## 2015-01-17 DIAGNOSIS — S8991XA Unspecified injury of right lower leg, initial encounter: Secondary | ICD-10-CM

## 2015-01-17 DIAGNOSIS — F17210 Nicotine dependence, cigarettes, uncomplicated: Secondary | ICD-10-CM | POA: Insufficient documentation

## 2015-01-17 DIAGNOSIS — X58XXXA Exposure to other specified factors, initial encounter: Secondary | ICD-10-CM | POA: Insufficient documentation

## 2015-01-17 MED ORDER — NAPROXEN 250 MG PO TABS
500.0000 mg | ORAL_TABLET | Freq: Two times a day (BID) | ORAL | Status: AC
Start: 2015-01-17 — End: 2015-02-16

## 2015-01-17 MED ORDER — OXYCODONE-ACETAMINOPHEN 5-325 MG PO TABS
2.0000 | ORAL_TABLET | Freq: Once | ORAL | Status: AC
Start: 2015-01-17 — End: 2015-01-17
  Administered 2015-01-17: 2 via ORAL
  Filled 2015-01-17: qty 2

## 2015-01-17 MED ORDER — OXYCODONE-ACETAMINOPHEN 5-325 MG PO TABS
1.0000 | ORAL_TABLET | Freq: Four times a day (QID) | ORAL | Status: DC | PRN
Start: 2015-01-17 — End: 2018-02-02

## 2015-01-17 NOTE — ED Notes (Signed)
Pt stated has a history of her knee giving out and today rt knee gave out and she fell.  Pt c/o pain bilateral knees

## 2015-01-17 NOTE — Discharge Instructions (Signed)
Osteoarthritis     You were diagnosed with osteoarthritis.    Osteoarthritis (OA) is a type of problem with the joints where they start to suffer from wear and tear, as you get older. It is also called "degenerative arthritis" or "degenerative joint disease."    Normally, there is a tough, rubbery layer of cartilage on the ends of each of the bones that makes up a joint. The cartilage is there to protect the bone and to provide a cushioned, slippery surface. This way, the bones can move smoothly when the joint is bending. When the cartilage gets worn, the bone ends are not as well protected. As the exposed bones rub against each other when the joint bends, it causes irritation, pain and swelling. Any joint can be affected. However, OA is most common in the spine, hands, hips, knees, and feet.     OA is more common as a person gets older. Aging itself does not cause OA. However, changes in the body that happen when one gets older make the chance of getting OA more likely.     Other things that can speed up the development of OA are:     Repetitive activity that involves constantly stressing the joints.   Being overweight. This puts more wear and tear on the joints.   Family history of OA. OA tends to run in families.   Fractures that extend into the joint and cause injury to the cartilage surfaces.     Symptoms of OA can include:   Joint pain. This is usually made worse with heavy activity and repetitive bending.   Stiffness in the joint when bending and moving the joint. This is usually worse in the morning. It gets better with activity as the joint "warms up."   Swelling. Joint swelling can happen when fluid seeps into the joint from chronic irritation.   Crackling and popping noises when the joint is moved.   Lumps of enlarged bone that can develop on the fingers.    There is no cure for OA. The treatment of OA is designed to control pain and slow down the wear and tear process. Some things that  are used to treat OA include:     Pain medications: Acetaminophen (Tylenol) and medications called NSAIDS like ibuprofen (Advil/Motrin) are often used first for pain control.     Steroid injections: This can cause temporary relief of pain. However, repeated injections into the joints can lead to more joint problems.     Life style changes: This includes weight loss and getting involved in exercise programs to help strengthen muscles around painful joints without making the wear and tear worse.     Physical therapy (PT): This may help to build strength and decrease joint stiffness.     In more severe cases, joint replacement surgery may be an option if symptoms can't be controlled using conventional measures.    There does NOT seem to be any evidence that "alternative treatments" like vitamins (A, E, and C), glucosamine, ginger, chondroitin sulfate and others have any helpful effects. ALWAYS talk to your doctor if you are going to try any alternative treatments.     Take all prescribed medications as directed. Unless you are told something different by your doctors, you can take over-the-counter pain medicines. These medicines can include ibuprofen (Advil/Motrin) or acetaminophen (Tylenol). Follow the directions on the package.    Get involved in any physical therapy recommended by your doctor.    YOU SHOULD   SEEK MEDICAL ATTENTION IMMEDIATELY, EITHER HERE OR AT THE NEAREST EMERGENCY DEPARTMENT, IF ANY OF THE FOLLOWING OCCUR:   You have any of the above symptoms and are unable to see or contact your doctor.   You get a fever (temperature higher than 100.4F or 38C) that is associated with pain or redness over your affected joint.   You have severe swelling in your painful joint.    If you can t follow up with your doctor, or if at any time you feel you need to be rechecked or seen again, come back here or go to the nearest emergency department.

## 2015-01-17 NOTE — ED Provider Notes (Signed)
EMERGENCY DEPARTMENT NOTE    Physician/Midlevel provider first contact with patient: 01/17/15 1003         HISTORY OF PRESENT ILLNESS   Historian:Patient  Translator Used: No    Chief Complaint: Fall     Mechanism of Injury: Nursing (triage) note reviewed for the following pertinent information:           54 y.o. female     1. Location of symptoms: bilateral knee pain  2. Onset of symptoms: several months ago  3. What was patient doing when symptoms started (Context): walking right knee "gave out" leading to a "almost fall" while trying to sit on a toilet this morning. After a brief period of rest pt was able to get up and walked with steady gait and limping. Pt reports known Hx of OA, no ortho follow up due to insurance constraints, no fevers, no calf pain or swelling noted.  4. Severity: moderate  5. Timing: intemrittent  6. Activities that worsen symptoms: nothing  7. Activities that improve symptoms: nothing  8. Quality: aching  9. Radiation of symptoms: no  10. Associated signs and Symptoms: see above  11. Are symptoms worsening? yes  MEDICAL HISTORY     Past Medical History:  Past Medical History   Diagnosis Date   . Seasonal allergic rhinitis    . Multiple thyroid nodules      noted 2013 admission       Past Surgical History:  History reviewed. No pertinent past surgical history.    Social History:  History     Social History   . Marital Status: Legally Separated     Spouse Name: N/A   . Number of Children: N/A   . Years of Education: N/A     Occupational History   . Not on file.     Social History Main Topics   . Smoking status: Current Every Day Smoker -- 4.00 packs/day for 40 years     Types: Cigarettes   . Smokeless tobacco: Not on file   . Alcohol Use: No   . Drug Use: No   . Sexual Activity: Not on file     Other Topics Concern   . Not on file     Social History Narrative       Family History:  Family History   Problem Relation Age of Onset   . Heart disease Mother    . Heart disease Sister    . Heart  disease Brother    . Kidney disease Father    . Heart disease Father    . Kidney disease Son      deceased    . Heart disease Son        Outpatient Medication:  Previous Medications    ASPIRIN 325 MG EC TABLET    Take 325 mg by mouth daily.    CETIRIZINE (ZYRTEC) 10 MG TABLET    Take 1 tablet (10 mg total) by mouth daily.         REVIEW OF SYSTEMS   Review of Systems   Constitutional: Negative for fever and chills.   HENT: Negative for congestion and sore throat.    Respiratory: Negative for cough, shortness of breath and wheezing.    Cardiovascular: Negative for chest pain.   Gastrointestinal: Negative for nausea, vomiting and abdominal pain.   Genitourinary: Negative for dysuria.   Musculoskeletal: Negative for myalgias, joint pain and falls.   Skin: Negative for rash.   Neurological: Negative  for headaches.   All other systems reviewed and are negative.       PHYSICAL EXAM   ED Triage Vitals   Enc Vitals Group      BP 01/17/15 0945 139/76 mmHg      Heart Rate 01/17/15 0945 96      Resp Rate 01/17/15 0945 16      Temp --       Temp src --       SpO2 01/17/15 0945 99 %      Weight 01/17/15 0945 90.719 kg      Height 01/17/15 0945 1.575 m      Head Cir --       Peak Flow --       Pain Score --       Pain Loc --       Pain Edu? --       Excl. in GC? --      Nursing note and vitals reviewed.  Constitutional:  Well developed, well nourished. Awake & Oriented x3.  Head:  Atraumatic. Normocephalic.      Cardiovascular:  Regular rate. Regular rhythm. No murmurs, rubs, or gallops.  Pulmonary/Chest:  No evidence of respiratory distress. Clear to auscultation bilaterally.  No wheezing, rales or rhonchi.   Abdominal:  Soft and non-distended. There is no tenderness. No rebound, guarding, or rigidity.  Extremities:  No edema. No cyanosis. No clubbing. Full range of motion in all extremities. Right  knee no deformity, no effusion, no erythema, no ecchymosis, skin normal color and temperature. FROM. Varus and valgus stress test  negative, anterior drawer test negative. Patella midline, no crepitus,  ttp to anterior knee.Proximal tibia without ttp. Able to bear weight.Pedal pulse strong, cap refill brisk, sensation intact.  Skin:  Skin is warm and dry.  No diaphoresis. No rash.   Neurological:  Alert, awake, and appropriate. Normal speech. Motor normal.  Psychiatric:  Good eye contact. Normal interaction, affect, and behavior.        MEDICAL DECISION MAKING   54 y.o. Female with PMhx of OA bilateral knees, no ortho follow up  Today injury to Right knee  Xray to r/o acute changes- negative  RICe and NSAIDs for pain, follow up with ortho instructed, pt agreed to plan  DISCUSSION      Vital Signs: Reviewed the patient?s vital signs.   Nursing Notes: Reviewed and utilized available nursing notes.  Medical Records Reviewed: Reviewed available past medical records.  Counseling: The emergency provider has spoken with the patient and discussed today?s findings, in addition to providing specific details for the plan of care.  Questions are answered and there is agreement with the plan.          IMAGING STUDIES    The following imaging studies were independently interpreted by the Emergency Medicine Physician.  For full imaging study results please see chart.        PULSE OXIMETRY    Oxygen Saturation by Pulse Oximetry: 99%  Interventions: none  Interpretation:  normal    EMERGENCY DEPT. MEDICATIONS      ED Medication Orders     Start Ordered     Status Ordering Provider    01/17/15 1026 01/17/15 1025  oxyCODONE-acetaminophen (PERCOCET) 5-325 MG per tablet 2 tablet   Once     Route: Oral  Ordered Dose: 2 tablet     Last MAR action:  Given Jonique Kulig PAVLOVNA          LABORATORY  RESULTS    Ordered and independently interpreted AVAILABLE laboratory tests. Please see results section in chart for full details.  Results for orders placed or performed during the hospital encounter of 01/18/14   CBC and differential   Result Value Ref Range    WBC 5.85  3.50 - 10.80 x10 3/uL    RBC 4.12 (L) 4.20 - 5.40 x10 6/uL    Hgb 11.9 (L) 12.0 - 16.0 g/dL    Hematocrit 16.1 (L) 37.0 - 47.0 %    MCV 89.3 80.0 - 100.0 fL    MCH 28.9 28.0 - 32.0 pg    MCHC 32.3 32.0 - 36.0 g/dL    RDW 13 12 - 15 %    Platelets 258 140 - 400 x10 3/uL    MPV 9.8 9.4 - 12.3 fL    Neutrophils 44 None %    Lymphocytes Automated 46 None %    Monocytes 6 None %    Eosinophils Automated 4 None %    Basophils Automated 1 None %    Immature Granulocyte 0 None %    Nucleated RBC 0 0 - 1 /100 WBC    Neutrophils Absolute 2.55 1.80 - 8.10 x10 3/uL    Abs Lymph Automated 2.67 0.50 - 4.40 x10 3/uL    Abs Mono Automated 0.33 0.00 - 1.20 x10 3/uL    Abs Eos Automated 0.23 0.00 - 0.70 x10 3/uL    Absolute Baso Automated 0.07 0.00 - 0.20 x10 3/uL    Absolute Immature Granulocyte 0.01 0 x10 3/uL   Comprehensive Metabolic Panel (CMP)   Result Value Ref Range    Glucose 88 70 - 100 mg/dL    BUN 10 7 - 21 mg/dL    Creatinine 0.7 0.6 - 1.0 mg/dL    Sodium 096 045 - 409 mEq/L    Potassium 4.3 3.5 - 5.1 mEq/L    Chloride 109 (H) 98 - 107 mEq/L    CO2 24 22 - 29 mEq/L    CALCIUM 9.1 8.5 - 10.5 mg/dL    Protein, Total 7.1 6.0 - 8.3 g/dL    Albumin 3.6 3.5 - 5.0 g/dL    AST (SGOT) 10 5 - 34 U/L    ALT 8 0 - 55 U/L    Alkaline Phosphatase 63 40 - 150 U/L    Bilirubin, Total 0.4 0.2 - 1.2 mg/dL    Globulin 3.5 2.0 - 3.6 g/dL    Albumin/Globulin Ratio 1.0 0.9 - 2.2   Troponin I   Result Value Ref Range    Troponin I <0.01 0.00 - 0.09 ng/mL   GFR   Result Value Ref Range    EGFR >60.0    D-Dimer   Result Value Ref Range    D-Dimer 0.47 0.00 - 0.51 ug/mL FEU   Troponin I   Result Value Ref Range    Troponin I <0.01 0.00 - 0.09 ng/mL   B-type Natriuretic Peptide   Result Value Ref Range    B-Natriuretic Peptide 37 0 - 100 pg/mL   TSH   Result Value Ref Range    Thyroid Stimulating Hormone 2.15 0.35 - 4.94 uIU/mL   Troponin I   Result Value Ref Range    Troponin I <0.01 0.00 - 0.09 ng/mL   Lipid panel   Result Value Ref Range     Cholesterol 166 0 - 199 mg/dL    Triglycerides 92 34 - 149 mg/dL    HDL 30 (L) >81 mg/dL  LDL Calculated 118 (H) 0 - 99 mg/dL    VLDL Cholesterol Cal 18 10 - 40 mg/dL    CHOL/HDL Ratio 5.5 See Below   Hemoglobin A1C   Result Value Ref Range    Hemoglobin A1C 5.8 0.0 - 6.0 %   Hemolysis index   Result Value Ref Range    Hemolysis Index 2 0 - 18   ECG 12 Lead   Result Value Ref Range    Ventricular Rate 74 BPM    Atrial Rate 74 BPM    P-R Interval 166 ms    QRS Duration 78 ms    Q-T Interval 380 ms    QTC Calculation (Bezet) 421 ms    P Axis 58 degrees    R Axis -14 degrees    T Axis 4 degrees           DIAGNOSIS      Diagnosis:  Final diagnoses:   Osteoarthritis of both knees, unspecified osteoarthritis type   Right knee injury, initial encounter       Disposition:  ED Disposition     Discharge [facility] Smoker [facility] discharge to home/self care.    Condition at disposition: Stable            Prescriptions:  Patient's Medications   New Prescriptions    NAPROXEN (NAPROSYN) 250 MG TABLET    Take 2 tablets (500 mg total) by mouth 2 (two) times daily with meals.    OXYCODONE-ACETAMINOPHEN (PERCOCET) 5-325 MG PER TABLET    Take 1 tablet by mouth every 6 (six) hours as needed for Pain.   Previous Medications    ASPIRIN 325 MG EC TABLET    Take 325 mg by mouth daily.    CETIRIZINE (ZYRTEC) 10 MG TABLET    Take 1 tablet (10 mg total) by mouth daily.   Modified Medications    No medications on file   Discontinued Medications    FUROSEMIDE (LASIX) 20 MG TABLET    Take 1 tablet (20 mg total) by mouth daily as needed.    HYDROCODONE-ACETAMINOPHEN (NORCO) 5-325 MG PER TABLET    Take 2 tablets by mouth every 6 (six) hours as needed for Pain.    IBUPROFEN (ADVIL,MOTRIN) 800 MG TABLET    Take 1 tablet (800 mg total) by mouth every 8 (eight) hours as needed for Pain.    SIMVASTATIN (ZOCOR) 10 MG TABLET    Take 1 tablet (10 mg total) by mouth daily with dinner.           Yancey Pedley, Samara Deist, NP  01/17/15 1125

## 2015-07-09 ENCOUNTER — Encounter: Payer: Self-pay | Admitting: Family Medicine

## 2015-07-09 ENCOUNTER — Ambulatory Visit (INDEPENDENT_AMBULATORY_CARE_PROVIDER_SITE_OTHER): Payer: Medicaid Other | Admitting: Family Medicine

## 2015-07-09 VITALS — BP 142/87 | HR 70 | Ht 62.0 in | Wt 256.0 lb

## 2015-07-09 DIAGNOSIS — R059 Cough, unspecified: Secondary | ICD-10-CM

## 2015-07-09 DIAGNOSIS — R221 Localized swelling, mass and lump, neck: Secondary | ICD-10-CM | POA: Diagnosis not present

## 2015-07-09 DIAGNOSIS — M25561 Pain in right knee: Secondary | ICD-10-CM | POA: Diagnosis not present

## 2015-07-09 DIAGNOSIS — M25562 Pain in left knee: Secondary | ICD-10-CM

## 2015-07-09 DIAGNOSIS — L309 Dermatitis, unspecified: Secondary | ICD-10-CM | POA: Diagnosis not present

## 2015-07-09 DIAGNOSIS — R05 Cough: Secondary | ICD-10-CM

## 2015-07-09 DIAGNOSIS — F329 Major depressive disorder, single episode, unspecified: Secondary | ICD-10-CM | POA: Diagnosis not present

## 2015-07-09 DIAGNOSIS — F32A Depression, unspecified: Secondary | ICD-10-CM | POA: Insufficient documentation

## 2015-07-09 MED ORDER — AZITHROMYCIN 250 MG PO TABS: ORAL_TABLET | ORAL | Status: AC

## 2015-07-09 MED ORDER — TRIAMCINOLONE ACETONIDE 0.1 % EX CREA: TOPICAL_CREAM | CUTANEOUS | Status: AC

## 2015-07-09 MED ORDER — ESCITALOPRAM OXALATE 10 MG PO TABS: 10.0000 mg | ORAL_TABLET | Freq: Every day | ORAL | Status: AC

## 2015-07-09 MED ORDER — MELOXICAM 15 MG PO TABS: 15.0000 mg | ORAL_TABLET | Freq: Every day | ORAL | Status: AC

## 2015-07-09 NOTE — Patient Instructions (Addendum)
1.) After checking out in our clinic go downstairs to our radiology/imaging office and ask them when you can have a ultrasound of your neck scheduled. An order for this is already in the computer system. 2.) Make sure that you bring your x-ray images on your CD when you follow up with our sports medicine doctors.

## 2015-07-09 NOTE — Progress Notes (Signed)
CC: Breanna Harris is a 54 y.o. female is here for Establish Care and Depression   Subjective: HPI:  Very pleasant 54 year old here to establish care, former resident of [place]  Patient tells me about a year ago she had an ultrasound on her neck and it revealed a mass. She's not sure if it's growing and it doesn't seem to bother her other than knowing that there something in her neck that shouldn't be there. She tells me she is not sure what she was supposed to do about it, she was without insurance at the time and had limited availability to healthcare. Chart review in 2013 she had an ultrasound of the thyroid which revealed a multinodular goiter with a dominant mass in the right lobe with prominent internal vascularity. A FNA was advised however she tells me she is unaware of this finding. She denies any unintentional weight loss or gain.  She recently lost her mother around the same time of the year that she lost her 77 year old son many years ago. She things about them on a daily basis and it causes her grief. She tells me at least for the last 2-3 months symptoms have caused her to lose interest in hobbies and has caused her subjective depression. No thoughts of wanting to harm herself or others. She reports frequent crying spells at home.  She was a bilateral knee pain present for matter of years. She brings in x-rays today from 2013 with a personal interpretation showing moderate medial joint space narrowing and multiple bone spurs. She tells me she's been given Percocet in the past but she believes it just hides the pain. She's not tried anything else for pain. It occasionally goes out on her. Pain is localized on the anterior aspect of the left knee and is not radiating. It's worse with bearing weight. Nothing else seems to make it better or worse. Occasionally it swells but has never been red or hot  She tells me that she gets bumps on the knuckles whenever she gets stressed out or  depressed. They're currently pretty bad. They're occasionally itchy. She picks at them but they grow back. No interventions as of yet. She denies skin lesions elsewhere.  She's had a nonproductive cough over the past 2 weeks. It's accompanied by some wheezing and fatigue. She denies any shortness of breath or chest pain. She smokes half a pack of cigarettes a day.   Review of Systems - General ROS: negative for - chills, fever, night sweats, weight gain or weight loss Ophthalmic ROS: negative for - decreased vision Psychological ROS: negative for - paranoia ENT ROS: negative for - hearing change, nasal congestion, tinnitus or allergies Hematological and Lymphatic ROS: negative for - bleeding problems, bruising or swollen lymph nodes Breast ROS: negative Respiratory ROS: Shortness of breath Cardiovascular ROS: no chest pain or dyspnea on exertion Gastrointestinal ROS: no abdominal pain, change in bowel habits, or black or bloody stools Genito-Urinary ROS: negative for - genital discharge, genital ulcers, incontinence or abnormal bleeding from genitals Musculoskeletal ROS: negative for - joint pain or muscle pain other than that described above Neurological ROS: negative for - headaches or memory loss Dermatological ROS: negative for lumps, mole changes, rash and skin lesion changes  Past Medical History  Diagnosis Date  . Thyroid disease     History reviewed. No pertinent past surgical history. Family History  Problem Relation Age of Onset  . Cancer    . Heart attack    . Diabetes    .  Hypertension    . Stroke      Social History   Social History  . Marital Status: Legally Separated    Spouse Name: N/A  . Number of Children: N/A  . Years of Education: N/A   Occupational History  . Not on file.   Social History Main Topics  . Smoking status: Current Every Day Smoker -- 0.50 packs/day for 30 years    Types: Cigarettes  . Smokeless tobacco: Not on file  . Alcohol Use: No   . Drug Use: No  . Sexual Activity: Not Currently   Other Topics Concern  . Not on file   Social History Narrative  . No narrative on file     Objective: BP 142/87 mmHg  Pulse 70  Ht  (1.575 m)  Wt 256 lb (116.121 kg)  BMI 46.81 kg/m2  General: Alert and Oriented, No Acute Distress HEENT: Pupils equal, round, reactive to light. Conjunctivae clear.  External ears unremarkable, canals clear with intact TMs with appropriate landmarks.  Middle ear appears open without effusion. Pink inferior turbinates.  Moist mucous membranes, pharynx without inflammation nor lesions.  Neck supple without palpable lymphadenopathy nor abnormal masses. Lungs: Clear to auscultation bilaterally, no wheezing/ronchi/rales.  Comfortable work of breathing. Good air movement. Cardiac: Regular rate and rhythm. Normal S1/S2.  No murmurs, rubs, nor gallops.   Abdomen: Obese Left knee exam shows full-strength and range of motion. There is no swelling, redness, nor warmth overlying the knee.  No patellar crepitus. No patellar apprehension. No pain with palpation of the inferior patellar pole.  No pain with valgus stress however pain is reproduced with varus stress.Marland Kitchen Anterior drawer is negative. Unable to perform McMurray's due to body habitus. No popliteal space tenderness or palpable mass. Both medial and lateral joint line tenderness especially anteriorly when palpated. Extremities: No peripheral edema.  Strong peripheral pulses.  Mental Status: No depression, anxiety, nor agitation. Skin: Warm and dry. Exam is changes on the knuckles  Assessment & Plan: Breanna Harris was seen today for establish care and depression.  Diagnoses and all orders for this visit:  Mass of right side of neck -     US Soft Tissue Head/Neck  Depression -     escitalopram (LEXAPRO) 10 MG tablet; Take 1 tablet (10 mg total) by mouth daily.  Bilateral knee pain -     meloxicam (MOBIC) 15 MG tablet; Take 1 tablet (15 mg total) by mouth  daily.  Chronic dermatitis of hands -     triamcinolone cream (KENALOG) 0.1 %; Apply to affected areas twice a day for up to two weeks, avoid face.  Cough -     azithromycin (ZITHROMAX) 250 MG tablet; Take two tabs at once on day 1, then one tab daily on days 2-5.   Mass on the right side of the neck: Not palpable today however getting a new ultrasound to help planning for possible FNA Depression: Start Lexapro and follow up in one month Bilateral knee pain left greater than right, definitely osteoarthritis is at play, possibility of meniscal tear given that her knee also gives out every once in a while. I encouraged her to start meloxicam and consider following up with sports medicine to talk about possible injections. I'm unable to do a corticosteroid  injection today blindly due to her body habitus Dermatitis looks to be due to eczema therefore start triamcinolone cream Cough: Likely bronchitis given her smoking status this should respond nicely to azithromycin, urged to quit smoking  45 minutes spent face-to-face during visit today of which at least 50% was counseling or coordinating care regarding: 1. Mass of right side of neck   2. Depression   3. Bilateral knee pain   4. Chronic dermatitis of hands   5. Cough       Return in about 4 weeks (around 08/06/2015) for Sports medicine evaluation of knees in 1-2 weeks, follow up with Dr. Ivan Anchors 1 month.Marland Kitchen

## 2015-07-23 ENCOUNTER — Ambulatory Visit (INDEPENDENT_AMBULATORY_CARE_PROVIDER_SITE_OTHER): Payer: Medicaid Other | Admitting: Family Medicine

## 2015-07-23 ENCOUNTER — Encounter: Payer: Self-pay | Admitting: Family Medicine

## 2015-07-23 ENCOUNTER — Ambulatory Visit (INDEPENDENT_AMBULATORY_CARE_PROVIDER_SITE_OTHER): Payer: Medicaid Other

## 2015-07-23 VITALS — BP 137/82 | HR 70 | Wt 255.0 lb

## 2015-07-23 VITALS — BP 144/90 | HR 69 | Wt 256.0 lb

## 2015-07-23 DIAGNOSIS — M25561 Pain in right knee: Secondary | ICD-10-CM

## 2015-07-23 DIAGNOSIS — M25562 Pain in left knee: Secondary | ICD-10-CM

## 2015-07-23 DIAGNOSIS — R05 Cough: Secondary | ICD-10-CM

## 2015-07-23 DIAGNOSIS — R059 Cough, unspecified: Secondary | ICD-10-CM

## 2015-07-23 MED ORDER — TRAMADOL HCL 50 MG PO TABS: 50.0000 mg | ORAL_TABLET | Freq: Three times a day (TID) | ORAL | Status: AC | PRN

## 2015-07-23 MED ORDER — GUAIFENESIN-CODEINE 100-10 MG/5ML PO SYRP: 5.0000 mL | ORAL_SOLUTION | Freq: Three times a day (TID) | ORAL | Status: AC | PRN

## 2015-07-23 NOTE — Progress Notes (Signed)
   Subjective:    I'm seeing this patient as a consultation for:  Bilateral knee pain.  CC: Bilateral left worse than right knee pain  HPI: Patient has a years long history of bilateral left worse than right knee pain. However the pain has been worse over the past 8 months or so. She denies any injuries radiating pain weakness or numbness. Her left knee hurts especially in the anterior aspect of the knee especially with prolonged sitting and climbing stairs. She notes popping clicking but denies any locking or giving way. She is to resume oral medicines which have not been very effective. She feels well otherwise.  Past medical history, Surgical history, Family history not pertinant except as noted below, Social history, Allergies, and medications have been entered into the medical record, reviewed, and no changes needed.   Review of Systems: No headache, visual changes, nausea, vomiting, diarrhea, constipation, dizziness, abdominal pain, skin rash, fevers, chills, night sweats, weight loss, swollen lymph nodes, body aches, joint swelling, muscle aches, chest pain, shortness of breath, mood changes, visual or auditory hallucinations.   Objective:    Filed Vitals:   07/23/15 0853  BP: 144/90  Pulse: 69   General: Well Developed, well nourished, and in no acute distress. Morbidly obese Neuro/Psych: Alert and oriented x3, extra-ocular muscles intact, able to move all 4 extremities, sensation grossly intact. Skin: Warm and dry, no rashes noted.  Respiratory: Not using accessory muscles, speaking in full sentences, trachea midline.  Cardiovascular: Pulses palpable, no extremity edema. Abdomen: Does not appear distended. MSK: Knees bilaterally without effusion. Range of motion intact year-120 with retropatellar crepitations bilaterally. Stable ligamentous exam bilaterally negative McMurray's test.   Preliminary x-ray of knees bilaterally show severe medial compartment DJD bilaterally, and  moderate to severe patellofemoral DJD of the left knee.  Procedure: Real-time Ultrasound Guided Injection of Left Knee  Device: GE Logiq E  Images permanently stored and available for review in the ultrasound unit. Verbal informed consent obtained. Discussed risks and benefits of procedure. Warned about infection bleeding damage to structures skin hypopigmentation and fat atrophy among others. Patient expresses understanding and agreement Time-out conducted.  Noted no overlying erythema, induration, or other signs of local infection.  Skin prepped in a sterile fashion.  Local anesthesia: Topical Ethyl chloride.  With sterile technique and under real time ultrasound guidance: 80 milligrams of Depo-Medrol, 4 mL of Marcaine injected easily.  Completed without difficulty  Pain immediately resolved suggesting accurate placement of the medication.  Advised to call if fevers/chills, erythema, induration, drainage, or persistent bleeding.  Images permanently stored and available for review in the ultrasound unit.  Impression: Technically successful ultrasound guided injection.    No results found for this or any previous visit (from the past 24 hour(s)). No results found.  Impression and Recommendations:   This case required medical decision making of moderate complexity.

## 2015-07-23 NOTE — Patient Instructions (Signed)
Thank you for coming in today. Return in 4 weeks.  Call or go to the ER if you develop a large red swollen joint with extreme pain or oozing puss.   Arthritis Arthritis is a term that is commonly used to refer to joint pain or joint disease. There are more than 100 types of arthritis. CAUSES The most common cause of this condition is wear and tear of a joint. Other causes include:  Gout.  Inflammation of a joint.  An infection of a joint.  Sprains and other injuries near the joint.  A drug reaction or allergic reaction. In some cases, the cause may not be known. SYMPTOMS The main symptom of this condition is pain in the joint with movement. Other symptoms include:  Redness, swelling, or stiffness at a joint.  Warmth coming from the joint.  Fever.  Overall feeling of illness. DIAGNOSIS This condition may be diagnosed with a physical exam and tests, including:  Blood tests.  Urine tests.  Imaging tests, such as MRI, X-rays, or a CT scan. Sometimes, fluid is removed from a joint for testing. TREATMENT Treatment for this condition may involve:  Treatment of the cause, if it is known.  Rest.  Raising (elevating) the joint.  Applying cold or hot packs to the joint.  Medicines to improve symptoms and reduce inflammation.  Injections of a steroid such as cortisone into the joint to help reduce pain and inflammation. Depending on the cause of your arthritis, you may need to make lifestyle changes to reduce stress on your joint. These changes may include exercising more and losing weight. HOME CARE INSTRUCTIONS Medicines  Take over-the-counter and prescription medicines only as told by your health care provider.  Do not take aspirin to relieve pain if gout is suspected. Activities  Rest your joint if told by your health care provider. Rest is important when your disease is active and your joint feels painful, swollen, or stiff.  Avoid activities that make the pain  worse. It is important to balance activity with rest.  Exercise your joint regularly with range-of-motion exercises as told by your health care provider. Try doing low-impact exercise, such as:  Swimming.  Water aerobics.  Biking.  Walking. Joint Care  If your joint is swollen, keep it elevated if told by your health care provider.  If your joint feels stiff in the morning, try taking a warm shower.  If directed, apply heat to the joint. If you have diabetes, do not apply heat without permission from your health care provider.  Put a towel between the joint and the hot pack or heating pad.  Leave the heat on the area for 20-30 minutes.  If directed, apply ice to the joint:  Put ice in a plastic bag.  Place a towel between your skin and the bag.  Leave the ice on for 20 minutes, 2-3 times per day.  Keep all follow-up visits as told by your health care provider. This is important. SEEK MEDICAL CARE IF:  The pain gets worse.  You have a fever. SEEK IMMEDIATE MEDICAL CARE IF:  You develop severe joint pain, swelling, or redness.  Many joints become painful and swollen.  You develop severe back pain.  You develop severe weakness in your leg.  You cannot control your bladder or bowels.   This information is not intended to replace advice given to you by your health care provider. Make sure you discuss any questions you have with your health care provider.  Document Released: 11/11/2004 Document Revised: 06/25/2015 Document Reviewed: 12/30/2014 Elsevier Interactive Patient Education [employer].

## 2015-07-23 NOTE — Progress Notes (Signed)
CC: Breanna SimmGeorgine Wiltse28 y.o. female is here for Cough and pain medications   Subjective: HPI:  Continued bilateral knee pain. It's localized deep within the joint and nonradiating. It's worse after periods of inactivity. Feels stiff and sore and swollen. She tells me it feels a tiny bit better ever since she had a sterile injection earlier this morning. She was taking meloxicam up to twice a day without any benefit. She is requesting some medicine other than meloxicam to help with pain. She denies any catching locking or giving way denies fevers, chills or joint pain elsewhere.  She's requesting something to help with cough. She believes antibiotic given to her 2 weeks ago helped with congestion however she still has a nonproductive cough interfering with her sleep. It is present all hours of the day. She denies wheezing or chest discomfort   Review Of Systems Outlined In HPI  Past Medical History  Diagnosis Date  . Thyroid disease     No past surgical history on file. Family History  Problem Relation Age of Onset  . Cancer    . Heart attack    . Diabetes    . Hypertension    . Stroke      Social History   Social History  . Marital Status: Legally Separated    Spouse Name: N/A  . Number of Children: N/A  . Years of Education: N/A   Occupational History  . Not on file.   Social History Main Topics  . Smoking status: Current Every Day Smoker -- 0.50 packs/day for 30 years    Types: Cigarettes  . Smokeless tobacco: Not on file  . Alcohol Use: No  . Drug Use: No  . Sexual Activity: Not Currently   Other Topics Concern  . Not on file   Social History Narrative     Objective: BP 137/82 mmHg  Pulse 70  Wt 255 lb (115.667 kg)  Vital signs reviewed. General: Alert and Oriented, No Acute Distress HEENT: Pupils equal, round, reactive to light. Conjunctivae clear.  External ears unremarkable.  Moist mucous membranes. Lungs: Clear and comfortable work of breathing,  speaking in full sentences without accessory muscle use. Cardiac: Regular rate and rhythm.  Neuro: CN II-XII grossly intact, gait normal. Extremities: No peripheral edema.  Strong peripheral pulses.  Mental Status: No depression, anxiety, nor agitation. Logical though process. Skin: Warm and dry.  Assessment & Plan: Breanna Harris was seen today for cough and pain medications.  Diagnoses and all orders for this visit:  Bilateral knee pain -     traMADol (ULTRAM) 50 MG tablet; Take 1 tablet (50 mg total) by mouth every 8 (eight) hours as needed.  Cough -     guaiFENesin-codeine (CHERATUSSIN AC) 100-10 MG/5ML syrup; Take 5 mLs by mouth 3 (three) times daily as needed for cough.   Bilateral knee pain: Expect my optimism that her pain will improve once her steroidal injection fully sets in in the next 2-3 days. If it helps return to see Dr. Denyse Amass for consideration of a possible right sided injection. In the meantime she can try tramadol to help with pain. Cough: Start as needed Cheratussin  Return if symptoms worsen or fail to improve.

## 2015-07-23 NOTE — Assessment & Plan Note (Signed)
Due to DJD. Steroid injection left knee today. Work on weight loss. Return in one month for recheck.

## 2015-07-23 NOTE — Progress Notes (Signed)
Quick Note:  Patient has arthritis of both knees. ______

## 2015-08-06 ENCOUNTER — Ambulatory Visit: Payer: Medicaid Other | Admitting: Family Medicine

## 2015-08-21 ENCOUNTER — Telehealth: Payer: Self-pay | Admitting: Family Medicine

## 2015-08-21 NOTE — Telephone Encounter (Signed)
Patient called and wanted to adv that she is moving out of [place] and will no longer be with our practice.

## 2015-08-27 ENCOUNTER — Ambulatory Visit: Payer: Medicaid Other | Admitting: Family Medicine

## 2016-11-15 ENCOUNTER — Ambulatory Visit: Payer: Self-pay | Admitting: Anesthesiology

## 2016-11-15 MED ORDER — BUPIVACAINE HCL (PF) 0.25 % IJ SOLN
INTRAMUSCULAR | Status: AC
Start: 2016-11-15 — End: ?
  Filled 2016-11-15: qty 30

## 2016-11-15 MED ORDER — METHYLPREDNISOLONE ACETATE 40 MG/ML IJ SUSP
INTRAMUSCULAR | Status: AC
Start: 2016-11-15 — End: ?
  Filled 2016-11-15: qty 2

## 2016-11-22 ENCOUNTER — Ambulatory Visit: Payer: 59 | Admitting: Anesthesiology

## 2016-11-22 ENCOUNTER — Ambulatory Visit: Payer: 59

## 2016-11-22 ENCOUNTER — Encounter
Admission: RE | Disposition: A | Discharge status: Home / Self Care | Payer: Self-pay | Source: Ambulatory Visit | Attending: Anesthesiology

## 2016-11-22 ENCOUNTER — Ambulatory Visit
Admission: RE | Admit: 2016-11-22 | Discharge: 2016-11-22 | Disposition: A | Discharge status: Home / Self Care | Payer: 59 | Source: Ambulatory Visit | Attending: Anesthesiology | Admitting: Anesthesiology

## 2016-11-22 HISTORY — DX: Myoneural disorder, unspecified: G70.9

## 2016-11-22 HISTORY — DX: Low back pain, unspecified: M54.50

## 2016-11-22 HISTORY — DX: Depression, unspecified: F32.A

## 2016-11-22 HISTORY — DX: Shortness of breath: R06.02

## 2016-11-22 HISTORY — PX: AX PAIN CLINIC REQUEST: SHX3219

## 2016-11-22 HISTORY — DX: Difficulty in walking, not elsewhere classified: R26.2

## 2016-11-22 HISTORY — DX: Unspecified osteoarthritis, unspecified site: M19.90

## 2016-11-22 SURGERY — AX PAIN CLINIC REQUEST
Anesthesia: Anesthesia Choice | Laterality: Left

## 2016-11-22 MED ORDER — BUPIVACAINE HCL (PF) 0.25 % IJ SOLN
INTRAMUSCULAR | Status: AC
Start: 2016-11-22 — End: ?
  Filled 2016-11-22: qty 30

## 2016-11-22 MED ORDER — METHYLPREDNISOLONE ACETATE 40 MG/ML IJ SUSP
INTRAMUSCULAR | Status: AC
Start: 2016-11-22 — End: ?
  Filled 2016-11-22: qty 2

## 2016-11-22 NOTE — OR Nursing (Signed)
Vital signs from 1317 to 1329 not valid.

## 2016-11-22 NOTE — Addendum Note (Signed)
Addendum  created 11/22/16 1437 by Santiago Bumpers, RN    Anesthesia Intra Flowsheets edited

## 2016-11-22 NOTE — Consults (Signed)
Service Date: 11/22/2016     Patient Type: A     CONSULTING PHYSICIAN: Caro Laroche MD     REFERRING PHYSICIAN: Alveta Heimlich MD     Dear Dr. Karilyn Cota, thank you for referring Ms. Worst to me for pain  management.  As you are well aware, this is a 56 year old lady who on  July 23, 2015, got a knee injection by a physician for her knee pain.   The patient states that since then she started having pain in her left leg  and lost feeling and function.  She has been a patient of [facility], and she stated that they were giving her pain medicine.  There  were no EMGs and nerve conduction studies done or any procedures on her  back or leg.  She also tells me that they found a pinched nerve in her  back.  The record is currently not available.  The patient's pain is all  over the left leg, but mostly from her knee to the foot.  This is  associated with numbness and tingling.  She denies any back pain.  She  denies any problems controlling her bowel or bladder function.  Her pain is  dull and deep aching with some sharp exacerbations.  It persists at night.   Nothing makes it better or worse.  She denies having any recent change in  her bowel or bladder function.  On her presentation to me today, she rated  her pain at 7 or 8/10 on a visual analog scale.     PAST MEDICAL HISTORY:  Significant for thyroid nodules, neuromyopathy and depression.  She also  has morbid obesity.     ALLERGIES:  The patient has no known drug allergies.     MEDICATIONS:  Flexeril, Motrin or Advil as needed for pain, Percocet prescribed by [facility] with last refill in early January.     FAMILY HISTORY:  Noncontributory.     SOCIAL HISTORY:  The patient used to be a CNA.  She is currently on disability.  She does  not smoke cigarettes or drink alcohol.     REVIEW OF SYSTEMS:  A 12-point review of systems is significant for pain in the lower left leg  as stated above, associated with numbness, tingling, and loss of function.      RADIOGRAPHIC FINDINGS:  X-rays of the right knee from April 2016 showed marked tricompartmental  osteoarthritis.  X-rays of the left knee from July 2012 showed  moderate-sized osteophyte in the distal femur and proximal tibia.  A CAT  scan of the cervical spine from October 2013 showed mild neural foraminal  narrowing at C4-C5, C5-C6, and C6-C7.  The rest of the record from [facility] is not available at this time.     PHYSICAL EXAMINATION:  The patient is 5 feet 2 inches tall and weighs 220 pounds.  She is afebrile  and her vital signs are stable.  She walks with a cane.  She tries not to  bear weight on the left leg.  There is no trigger point tenderness over the  lumbar paraspinal muscle area.  There is decrease in sensation or literally  no feeling in the left leg below the knee, as the patient states.  Deep  tendon reflexes are decreased at 1+ at the ankles.  Motor exam of the left  lower extremity revealed no strength.  On the other hand, there is no  atrophy in the leg.  Both legs looked the same.  Lifting up the left leg  causes severe leg pain and low back pain.     ASSESSMENT:  Chronic left lower extremity pain of an unknown origin.     PLAN:  I will await the records from [facility].  I think the patient needs  EMGs and nerve conduction studies of the left lower extremity if this has  not been done.  I would like to see recent imaging of the left knee as well  and maybe of the lower back.  Further management would be planned later.     I thank you again, Dr. Karilyn Cota, for referring the patient to me for  pain management.  I will keep you updated on her pain status.           D:  11/22/2016 14:22 PM by Dr. Caro Laroche, MD ([id number])  T:  11/22/2016 15:17 PM by AZ      (Conf: [id number]) (Doc ID: [id number])

## 2016-11-22 NOTE — Addendum Note (Signed)
Addendum  created 11/22/16 1452 by Santiago Bumpers, RN    Anesthesia Intra Flowsheets edited, Sign clinical note

## 2016-11-29 ENCOUNTER — Encounter: Payer: Self-pay | Admitting: Anesthesiology

## 2016-12-02 ENCOUNTER — Ambulatory Visit: Admission: RE | Admit: 2016-12-02 | Payer: 59 | Source: Ambulatory Visit | Admitting: Anesthesiology

## 2016-12-02 ENCOUNTER — Encounter: Admission: RE | Payer: Self-pay | Source: Ambulatory Visit

## 2016-12-02 SURGERY — AX PAIN CLINIC REQUEST
Anesthesia: Choice | Laterality: Left

## 2017-03-01 ENCOUNTER — Ambulatory Visit
Admission: RE | Admit: 2017-03-01 | Discharge: 2017-03-01 | Disposition: A | Discharge status: Home / Self Care | Payer: No Typology Code available for payment source | Source: Ambulatory Visit | Attending: Family Medicine | Admitting: Family Medicine

## 2017-03-01 ENCOUNTER — Other Ambulatory Visit: Payer: Self-pay | Admitting: Family Medicine

## 2017-03-01 DIAGNOSIS — M25562 Pain in left knee: Secondary | ICD-10-CM

## 2017-04-01 ENCOUNTER — Other Ambulatory Visit: Payer: Self-pay | Admitting: Family Medicine

## 2017-04-01 DIAGNOSIS — Z1231 Encounter for screening mammogram for malignant neoplasm of breast: Secondary | ICD-10-CM

## 2017-04-07 ENCOUNTER — Ambulatory Visit
Admission: RE | Admit: 2017-04-07 | Discharge: 2017-04-07 | Disposition: A | Discharge status: Home / Self Care | Payer: No Typology Code available for payment source | Source: Ambulatory Visit | Attending: Family Medicine | Admitting: Family Medicine

## 2017-04-07 DIAGNOSIS — Z1231 Encounter for screening mammogram for malignant neoplasm of breast: Secondary | ICD-10-CM | POA: Insufficient documentation

## 2017-04-11 ENCOUNTER — Ambulatory Visit (INDEPENDENT_AMBULATORY_CARE_PROVIDER_SITE_OTHER): Payer: No Typology Code available for payment source | Admitting: Cardiovascular Disease

## 2017-04-11 ENCOUNTER — Encounter (INDEPENDENT_AMBULATORY_CARE_PROVIDER_SITE_OTHER): Payer: Self-pay | Admitting: Cardiovascular Disease

## 2017-04-11 VITALS — BP 118/82 | HR 62 | Resp 18 | Ht 62.0 in | Wt 254.0 lb

## 2017-04-11 DIAGNOSIS — R600 Localized edema: Secondary | ICD-10-CM

## 2017-04-11 DIAGNOSIS — E7849 Other hyperlipidemia: Secondary | ICD-10-CM

## 2017-04-11 DIAGNOSIS — Z6841 Body Mass Index (BMI) 40.0 and over, adult: Secondary | ICD-10-CM

## 2017-04-11 DIAGNOSIS — F17210 Nicotine dependence, cigarettes, uncomplicated: Secondary | ICD-10-CM | POA: Insufficient documentation

## 2017-04-11 IMAGING — CR DG KNEE COMPLETE 4+V*R*
6 series · 6 of 6 positions shown · non-contrast
Comparison: None.

CLINICAL DATA: Bilateral knee pain

EXAM:
RIGHT KNEE - COMPLETE 4+ VIEW

[knee ap]
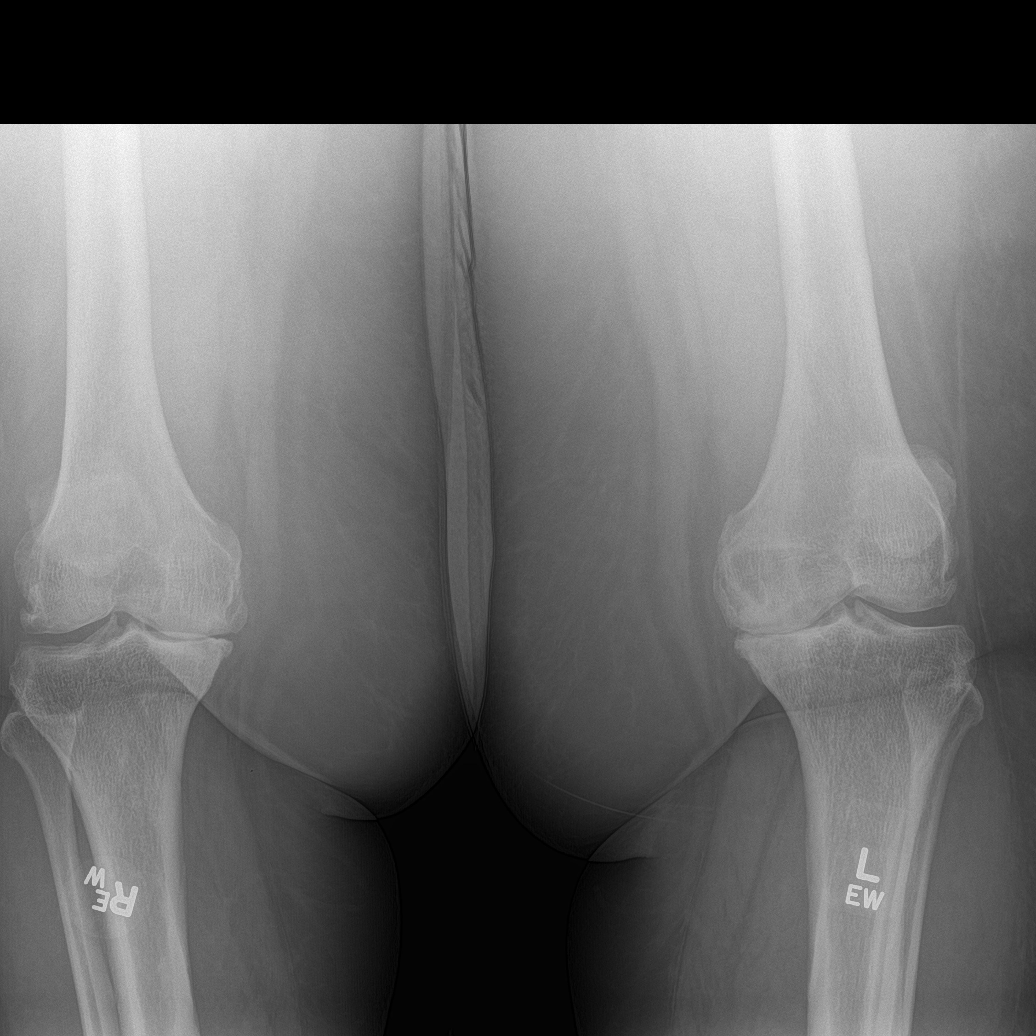

[tunnel]
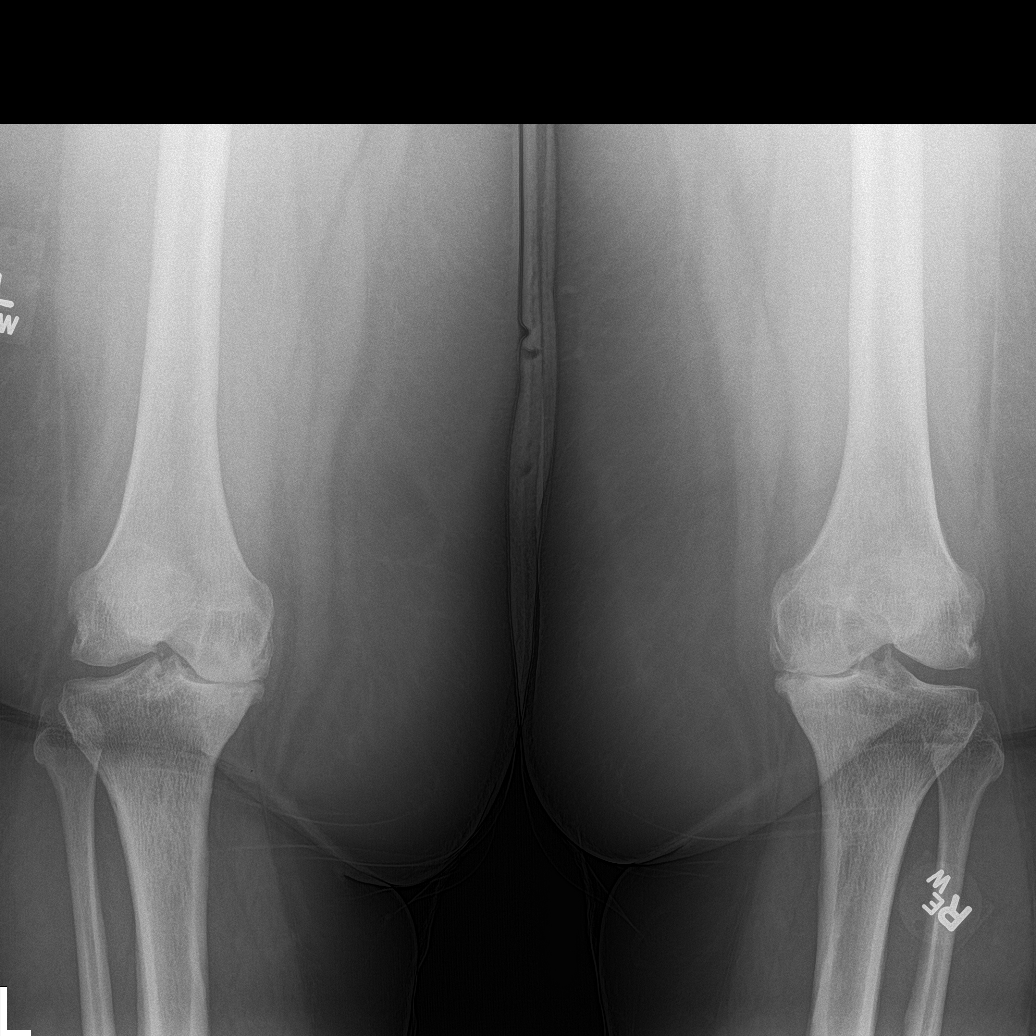

[knee lat (1 of 2)]
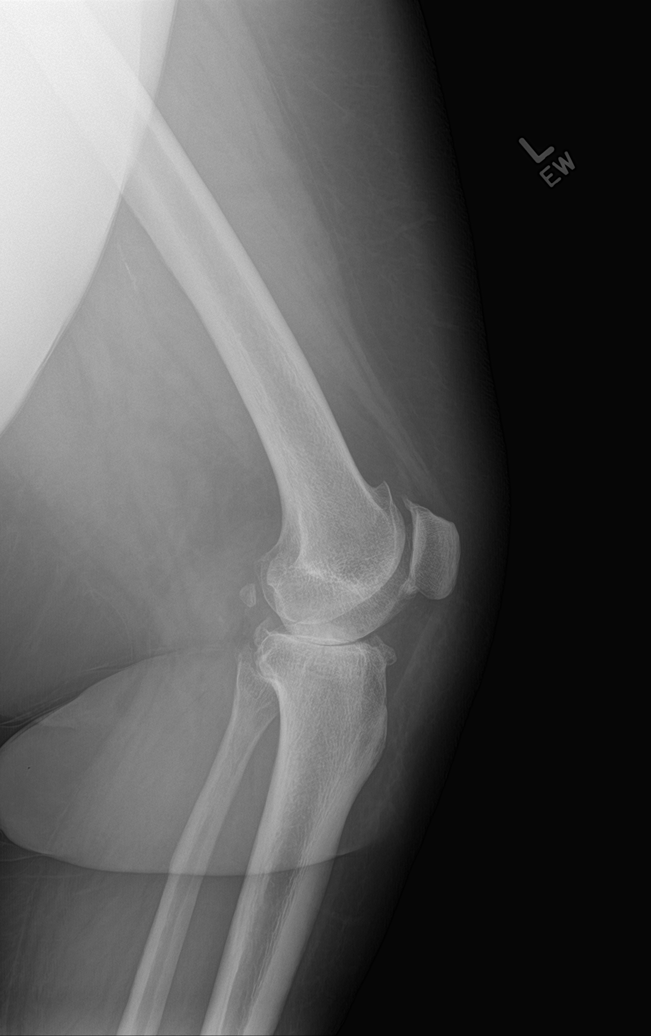

[knee sunrise (1 of 2)]
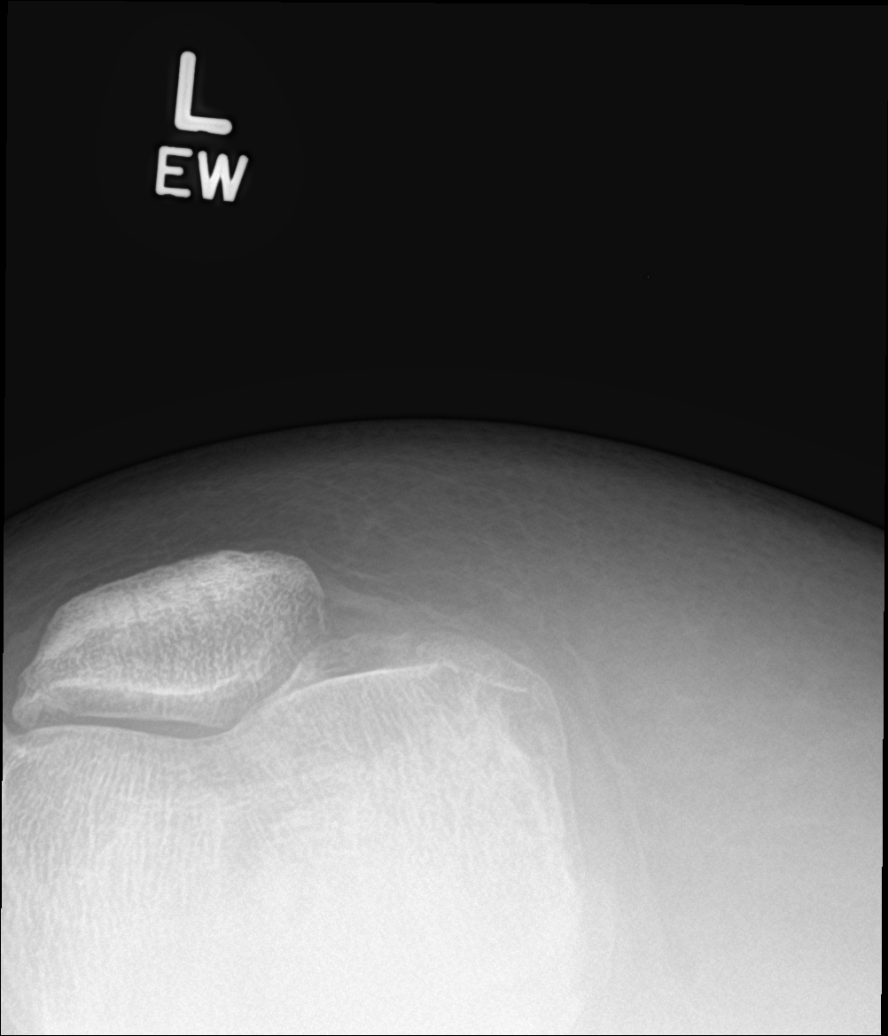

[knee lat (2 of 2)]
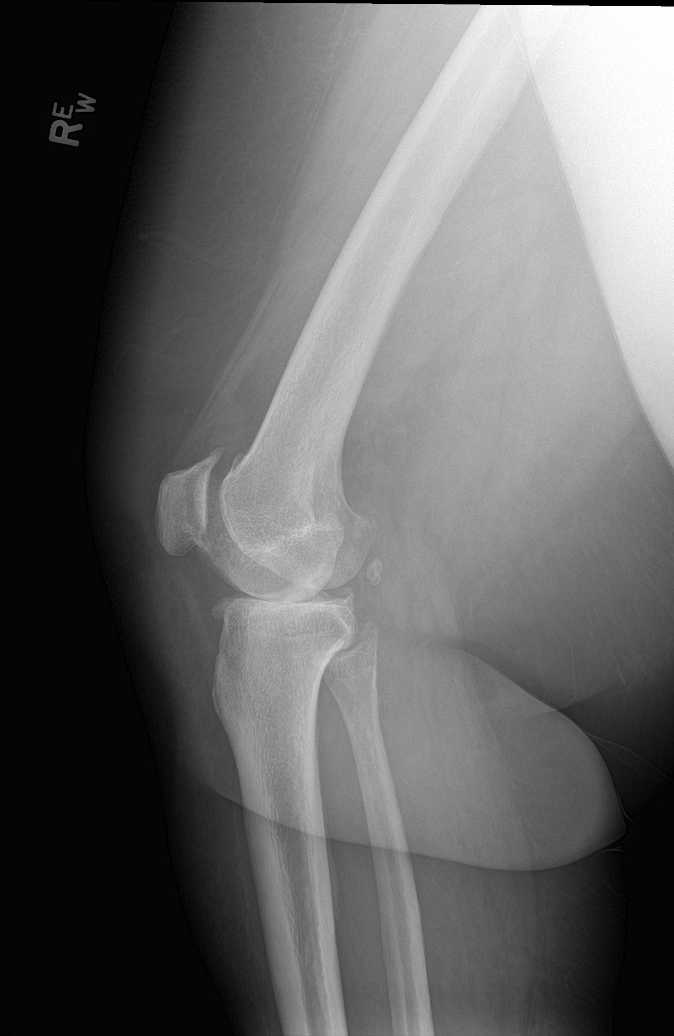

[knee sunrise (2 of 2)]
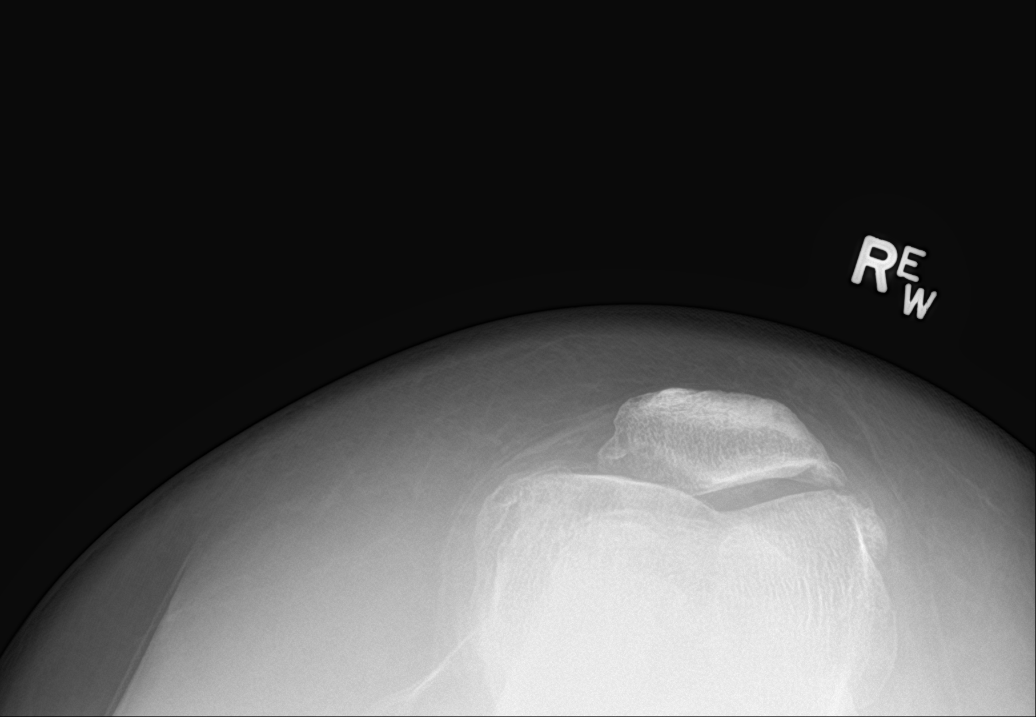

[6 of 6 positions shown; findings below may reference images not displayed]

FINDINGS: Right knee: Advanced joint space narrowing medial joint compartment
with associated spurring. Mild joint space narrowing laterally. Mild
patellar spurring. Small joint effusion. Negative for fracture

Left knee: Advanced degenerative change in the medial compartment
with marked cartilage loss and spurring. Mild lateral marginal
spurring without significant joint space narrowing. Mild patellar
spurring. Negative for fracture.
IMPRESSION: Advanced degenerative change in the medial joint compartment
bilaterally. Negative for fracture.

## 2017-04-11 MED ORDER — FUROSEMIDE 40 MG PO TABS
40.0000 mg | ORAL_TABLET | Freq: Every day | ORAL | 3 refills | Status: AC
Start: 2017-04-11 — End: ?

## 2017-04-11 NOTE — Progress Notes (Signed)
[facility]    Chief Complaint   Patient presents with   . Edema     Ankles and feet swollen, left side more swollen   . Dizziness     intermittently          History of Present Illness   She presents for a cardiac evaluation. She has LE edema over the past 10-14 days . She has been treated with lasix 20 mg. It has improved somewhat. She was previously evaluated for chest pain at [facility] and stress MPI was negative . Echo was normal. It was recommended that she have polysomnography but she did not follow through. She has limited mobility and is on disability due to pain LLE. She continues to smoke 1/2 ppd cigarettes and has + FH CAD. There is no HTN.      Past Medical History     Past Medical History:   Diagnosis Date   . Arthritis    . Depression    . Difficulty walking     walks with cane   . Low back pain     herniated disc   . Multiple thyroid nodules     noted 2013 admission   . Neuromyopathy     toes numb   . Seasonal allergic rhinitis    . Shortness of breath        Past Surgical History     Past Surgical History:   Procedure Laterality Date   . AX PAIN CLINIC REQUEST Left 11/22/2016    Procedure: AX PAIN CLINIC REQUEST;  Surgeon: Caro Laroche, MD;  Location: [facility];  Service: Anesthesiology;  Laterality: Left;  NEW PT - WILL FAX ORDER BEFORE ARRIVAL..VP       Family History     Family History   Problem Relation Age of Onset   . Heart disease Mother    . Heart disease Brother    . Heart disease Sister    . Kidney disease Father    . Heart disease Father    . Kidney disease Son      deceased    . Heart disease Son    . Malignant hyperthermia Neg Hx    . Pseudochol deficiency Neg Hx    . Breast cancer Neg Hx        Social History     Social History     Social History   . Marital status: Legally Separated     Spouse name: N/A   . Number of children: N/A   . Years of education: N/A     Occupational History   . Not on file.     Social History Main Topics   . Smoking status: Current Every Day Smoker      Packs/day: 4.00     Years: 40.00     Types: Cigarettes   . Smokeless tobacco: Never Used   . Alcohol use No   . Drug use: No   . Sexual activity: Not on file     Other Topics Concern   . Not on file     Social History Narrative   . No narrative on file       Allergies     No Known Allergies    Medications     Current Outpatient Prescriptions on File Prior to Visit   Medication Sig Dispense Refill   . cetirizine (ZYRTEC) 10 MG tablet Take 1 tablet (10 mg total) by mouth daily. 90 tablet 0   .  ESCITALOPRAM OXALATE PO Take by mouth.     . oxyCODONE-acetaminophen (PERCOCET) 5-325 MG per tablet Take 1 tablet by mouth every 6 (six) hours as needed for Pain. 10 tablet 0   . [DISCONTINUED] cyclobenzaprine (FLEXERIL) 5 MG tablet Take 5 mg by mouth as needed for Muscle spasms.     . [DISCONTINUED] ibuprofen (ADVIL,MOTRIN) 800 MG tablet Take 800 mg by mouth as needed for Pain.       No current facility-administered medications on file prior to visit.        Review of Systems     Constitutional: Negative for fevers and chills  Skin: No rash or lesions  Respiratory: Negative for cough, wheezing, or hemoptysis  Cardiovascular: as per HPI  Gastrointestinal: Negative for abdominal pain, nausea, vomiting and diarrhea  Musculoskeletal:  No arthritic symptoms  Genitourinary: Negative for dysuria  Otherwise 10 point review of systems is negative.      Physical Exam     Vitals:    04/11/17 0834   BP: 118/82   Pulse: 62   Resp: 18       Body mass index is 46.46 kg/m.    General:  Patient appears their stated age, well-nourished.  Alert and in no apparent distress.  Eyes: No conjunctivitis, no purulent discharge, no lid lag  ENT:  Hearing grossly intact, Nares patent bilaterally, Lips moist, color appropriate for race.  Respiratory: Clear to auscultation and percussion throughout. Respiratory effort unlabored, chest expansion symmetric.    Cardio: Regular rate and rhythm. Normal S1/S2 No carotid bruits or thrills, no JVD.   Extremities: warm, pulses 2+, no peripheral edema  GI: Soft, nondistended, nontender.  No guarding or rebound.  Skin: Color appropriate for race, Skin warm, dry, and intact  Psychiatric: Good insight and judgment, oriented to person, place, and time    Labs     CBC:   WBC   Date/Time Value Ref Range Status   01/18/2014 11:18 AM 5.85 3.50 - 10.80 x10 3/uL Final   07/03/2008 01:12 PM 7.25 3.50 - 10.80 /CUMM Final     RBC   Date/Time Value Ref Range Status   01/18/2014 11:18 AM 4.12 (L) 4.20 - 5.40 x10 6/uL Final     Hgb   Date/Time Value Ref Range Status   01/18/2014 11:18 AM 11.9 (L) 12.0 - 16.0 g/dL Final     Hematocrit   Date/Time Value Ref Range Status   01/18/2014 11:18 AM 36.8 (L) 37.0 - 47.0 % Final     MCV   Date/Time Value Ref Range Status   01/18/2014 11:18 AM 89.3 80.0 - 100.0 fL Final     MCHC   Date/Time Value Ref Range Status   01/18/2014 11:18 AM 32.3 32.0 - 36.0 g/dL Final     RDW   Date/Time Value Ref Range Status   01/18/2014 11:18 AM 13 12 - 15 % Final     Platelets   Date/Time Value Ref Range Status   01/18/2014 11:18 AM 258 140 - 400 x10 3/uL Final     Comment:     PLT CLUMPS PRESENT. PLTS ARE ADEQUATE.       CMP:   Sodium   Date/Time Value Ref Range Status   01/18/2014 11:18 AM 141 136 - 145 mEq/L Final     Potassium   Date/Time Value Ref Range Status   01/18/2014 11:18 AM 4.3 3.5 - 5.1 mEq/L Final     Chloride   Date/Time Value Ref Range Status  01/18/2014 11:18 AM 109 (H) 98 - 107 mEq/L Final     CO2   Date/Time Value Ref Range Status   01/18/2014 11:18 AM 24 22 - 29 mEq/L Final     Glucose   Date/Time Value Ref Range Status   01/18/2014 11:18 AM 88 70 - 100 mg/dL Final     Comment:     Interpretive Data for Adult Female and Female Population  Indeterminate Range:  100-125 mg/dL  Equal to or greater than 126 mg/dL meets the ADA  guidelines for Diabetes Mellitus diagnosis if symptoms  are present and confirmed by repeat testing.  Random (Non-Fasting)Interpretive Data (Adults):  Equal to or  greater than 200 mg/dL meets the ADA  guidelines for Diabetes Mellitus diagnosis if symptoms  are present and confirmed by Fasting Glucose or GTT.     BUN   Date/Time Value Ref Range Status   01/18/2014 11:18 AM 10 7 - 21 mg/dL Final     Protein, Total   Date/Time Value Ref Range Status   01/18/2014 11:18 AM 7.1 6.0 - 8.3 g/dL Final     Alkaline Phosphatase   Date/Time Value Ref Range Status   01/18/2014 11:18 AM 63 40 - 150 U/L Final     AST (SGOT)   Date/Time Value Ref Range Status   01/18/2014 11:18 AM 10 5 - 34 U/L Final     ALT   Date/Time Value Ref Range Status   01/18/2014 11:18 AM 8 0 - 55 U/L Final       Lipid Panel   Cholesterol   Date/Time Value Ref Range Status   01/19/2014 04:59 AM 166 0 - 199 mg/dL Final     Triglycerides   Date/Time Value Ref Range Status   01/19/2014 04:59 AM 92 34 - 149 mg/dL Final     HDL   Date/Time Value Ref Range Status   01/19/2014 04:59 AM 30 (L) >40 mg/dL Final     Comment:     HDL:       Less than 40 mg/dL - Major risk heart disease       Greater than or equal to 60 mg/dL - Negative risk factor       for heart disease         EKG   I have reviewed and interpreted the EKG.  The EKG is significant for SR Low voltage o/w normal    Assessment and Plan       1. Bilateral lower extremity edema  ECG 12 lead (Today)    furosemide (LASIX) 40 MG tablet    Transthoracic Echocardiogram (TTE)   2. Other hyperlipidemia     3. Morbid obesity  Ambulatory referral to Sleep Studies - [place]   4. Cigarette smoker one half pack a day or less       Plan Will increase lasix to 40 /d.Obtain echocardiogram and sleep study recommended to her

## 2017-04-13 ENCOUNTER — Encounter (INDEPENDENT_AMBULATORY_CARE_PROVIDER_SITE_OTHER): Payer: Self-pay | Admitting: Cardiovascular Disease

## 2017-04-22 ENCOUNTER — Ambulatory Visit (INDEPENDENT_AMBULATORY_CARE_PROVIDER_SITE_OTHER): Payer: No Typology Code available for payment source

## 2017-04-22 DIAGNOSIS — R600 Localized edema: Secondary | ICD-10-CM

## 2017-04-25 ENCOUNTER — Ambulatory Visit (INDEPENDENT_AMBULATORY_CARE_PROVIDER_SITE_OTHER): Payer: 59 | Admitting: Cardiovascular Disease

## 2017-04-27 ENCOUNTER — Telehealth (INDEPENDENT_AMBULATORY_CARE_PROVIDER_SITE_OTHER): Payer: Self-pay | Admitting: Internal Medicine

## 2017-04-27 NOTE — Telephone Encounter (Signed)
I, left a message for hte patient regarding the instructions of her recent echo result, also I faxed the final report to her PCP.

## 2017-04-27 NOTE — Telephone Encounter (Signed)
-----   Message from Joellyn Haff, MD sent at 04/22/2017 11:50 PM EDT -----  Normal EF, mild anteroseptal hypokinesis. Needs follow up with Dr Donley Redder.

## 2017-07-04 ENCOUNTER — Encounter (INDEPENDENT_AMBULATORY_CARE_PROVIDER_SITE_OTHER): Payer: Self-pay | Admitting: Neurology

## 2017-07-05 ENCOUNTER — Ambulatory Visit (INDEPENDENT_AMBULATORY_CARE_PROVIDER_SITE_OTHER): Payer: No Typology Code available for payment source | Admitting: Neurology

## 2017-08-02 ENCOUNTER — Other Ambulatory Visit: Payer: Self-pay | Admitting: Family Medicine

## 2017-08-02 ENCOUNTER — Other Ambulatory Visit: Payer: Self-pay | Admitting: Neurology

## 2017-08-02 DIAGNOSIS — M5106 Intervertebral disc disorders with myelopathy, lumbar region: Secondary | ICD-10-CM

## 2017-08-02 DIAGNOSIS — Z8639 Personal history of other endocrine, nutritional and metabolic disease: Secondary | ICD-10-CM

## 2017-08-05 ENCOUNTER — Ambulatory Visit
Admission: RE | Admit: 2017-08-05 | Discharge: 2017-08-05 | Disposition: A | Discharge status: Home / Self Care | Payer: No Typology Code available for payment source | Source: Ambulatory Visit | Attending: Family Medicine | Admitting: Family Medicine

## 2017-08-05 DIAGNOSIS — R590 Localized enlarged lymph nodes: Secondary | ICD-10-CM | POA: Insufficient documentation

## 2017-08-05 DIAGNOSIS — Z8639 Personal history of other endocrine, nutritional and metabolic disease: Secondary | ICD-10-CM

## 2017-08-05 DIAGNOSIS — E042 Nontoxic multinodular goiter: Secondary | ICD-10-CM | POA: Insufficient documentation

## 2017-08-10 ENCOUNTER — Ambulatory Visit: Payer: No Typology Code available for payment source

## 2017-11-30 ENCOUNTER — Ambulatory Visit
Admission: RE | Admit: 2017-11-30 | Discharge: 2017-11-30 | Disposition: A | Discharge status: Home / Self Care | Payer: No Typology Code available for payment source | Source: Ambulatory Visit | Attending: Family Medicine | Admitting: Family Medicine

## 2017-11-30 ENCOUNTER — Ambulatory Visit
Admission: RE | Admit: 2017-11-30 | Discharge: 2017-11-30 | Disposition: A | Discharge status: Home / Self Care | Payer: No Typology Code available for payment source | Source: Ambulatory Visit | Attending: Anesthesiology | Admitting: Anesthesiology

## 2017-11-30 ENCOUNTER — Other Ambulatory Visit: Payer: Self-pay | Admitting: Family Medicine

## 2017-11-30 ENCOUNTER — Other Ambulatory Visit: Payer: Self-pay | Admitting: Anesthesiology

## 2017-11-30 DIAGNOSIS — Z1239 Encounter for other screening for malignant neoplasm of breast: Secondary | ICD-10-CM

## 2017-11-30 DIAGNOSIS — M25562 Pain in left knee: Secondary | ICD-10-CM

## 2017-11-30 DIAGNOSIS — M25561 Pain in right knee: Secondary | ICD-10-CM

## 2017-11-30 DIAGNOSIS — M542 Cervicalgia: Secondary | ICD-10-CM

## 2017-11-30 DIAGNOSIS — M160 Bilateral primary osteoarthritis of hip: Secondary | ICD-10-CM

## 2017-12-02 ENCOUNTER — Encounter (INDEPENDENT_AMBULATORY_CARE_PROVIDER_SITE_OTHER): Payer: Self-pay | Admitting: No Specialty

## 2017-12-02 ENCOUNTER — Ambulatory Visit (INDEPENDENT_AMBULATORY_CARE_PROVIDER_SITE_OTHER): Payer: No Typology Code available for payment source | Admitting: No Specialty

## 2017-12-02 VITALS — BP 137/80 | HR 76 | Ht 62.0 in | Wt 200.0 lb

## 2017-12-02 DIAGNOSIS — R200 Anesthesia of skin: Secondary | ICD-10-CM

## 2017-12-02 NOTE — Patient Instructions (Addendum)
Fill out authorization so we can get a copy of the MRI L spine reuslts    Schedule EMG/NCS    F/up in clinic in 2 mos to discuss results

## 2017-12-02 NOTE — Progress Notes (Signed)
CC: numbness    HPI:   History was obtained from patient     57 y.o. year-old female with numbness since 2016.  It is confined to the left lower extremity in the lateral aspect from the thigh to the entire leg distal to the calf.  The numbness is constant.  The numbness is not painful.  She also feels like her left leg is "weak" and "flaps".  She can walk up and downstairs with a cane.  No low back pain.  No right LE numbness or weakness.  Also no numbness or weakness in UE or neck pain.  No bowel  or bladder inconitnence or saddle paresthesias.   Only possible trigger she can think of is getting a cortisone injection in the knee around the time of symptoms onset.      She was seen previously by a neurologist for this.  MRI L spine was done, EMG/NCS was not done.   Review of Previous records reveals:  Ortho note Aug 2018 "As 1st left leg and foot numbness concerned, she was advised continue the see neurologist since this is most likely nerve related symptoms and possibly from lumbar sacral discogenic problem. For the bilateral knee pain, I explained her about more invasive optional treatment. Since patient does want have injection treatment at the present time, The patient was advised to utilize Diclofenac-XR 100 milligram once a day after meals only as needed. I also discussed about the weight which is contributing significantly to her bilateral knee problem. She was advised to return to my office in 6 months for reexamination and x-ray study of the bilateral standing Zoila Shutter views."    Past Medical, Surgical, Social, Family History: Per Epic. Reviewed and updated.     Med / Surg  Past Medical History:   Diagnosis Date   . Arthritis    . Depression    . Difficulty walking     walks with cane   . Low back pain     herniated disc   . Multiple thyroid nodules     noted 2013 admission   . Neuromyopathy     toes numb   . Seasonal allergic rhinitis    . Shortness of breath        Soc  Social History     Social History    . Marital status: Legally Separated     Spouse name: N/A   . Number of children: N/A   . Years of education: N/A     Occupational History   . Not on file.     Social History Main Topics   . Smoking status: Current Every Day Smoker     Packs/day: 4.00     Years: 40.00     Types: Cigarettes   . Smokeless tobacco: Never Used   . Alcohol use No   . Drug use: No   . Sexual activity: Not on file     Other Topics Concern   . Not on file     Social History Narrative   . No narrative on file       Fam-   Noncontributory  Family History   Problem Relation Age of Onset   . Heart disease Mother    . Heart disease Brother    . Heart disease Sister    . Kidney disease Father    . Heart disease Father    . Kidney disease Son         deceased    .  Heart disease Son    . Malignant hyperthermia Neg Hx    . Pseudochol deficiency Neg Hx    . Breast cancer Neg Hx        Review of Systems   Musculoskeletal: Negative for back pain and neck pain.   Neurological: Positive for tingling and sensory change.   All other systems reviewed and are negative.        Meds:  Current Outpatient Prescriptions on File Prior to Visit   Medication Sig Dispense Refill   . cetirizine (ZYRTEC) 10 MG tablet Take 1 tablet (10 mg total) by mouth daily. 90 tablet 0   . diclofenac sodium (VOLTAREN) 1 % Gel topical gel Apply topically 4 (four) times daily.     Marland Kitchen ESCITALOPRAM OXALATE PO Take by mouth.     . furosemide (LASIX) 40 MG tablet Take 1 tablet (40 mg total) by mouth daily. 30 tablet 3   . ibuprofen (ADVIL,MOTRIN) 600 MG tablet Take 600 mg by mouth every 6 (six) hours as needed for Pain.     Marland Kitchen oxyCODONE-acetaminophen (PERCOCET) 5-325 MG per tablet Take 1 tablet by mouth every 6 (six) hours as needed for Pain. 10 tablet 0   . traZODone (DESYREL) 50 MG tablet Take 50 mg by Mouth Every Night at Bedtime.     . [DISCONTINUED] cyclobenzaprine (FLEXERIL) 10 MG tablet TAKE 1 TABLET BY MOUTH TWICE A DAY AS NEEDED  1     No current facility-administered medications  on file prior to visit.        EXAM:  Visit Vitals  BP 137/80   Pulse 76   Ht 1.575 m (5\' 2" )   Wt 90.7 kg (200 lb)   BMI 36.58 kg/m     Respiratory rate normal    General: Morbidly obese F in no acute distress. There is no icterus or cyanosis or pedal edema. Peripheral pulses are intact.   Heart  RRR, no murmur/rubs/gallops. No carotid bruits  Lungs clear no wheezes  Abdomen soft, not tender  Skin: no rashes on extremities or joint swelling  Neck: not tender, normal ROM    Psychiatric. Cooperative. Thought content and process normal. Mood good. Affect congruent.     Mental Status: The patient was awake, alert, appeared oriented and was appropriate with examiner. Language is fluent and the patient followed commands well. Attention, concentration, and memory appeared intact. Fund of knowledge appeared appropriate for education level.     Cranial Nerves: Pupils were equally round and reactive to light bilaterally. No APD. Extraocular movements were intact without nystagmus. Light touch was intact V1-3.Face was symmetric with normal strength. Hearing was intact to conversational speech.  Speech without hypophonia or dysarthria. Palate volitionally rises symmetrically. Tongue appeared midline with no atrophy.     Motor: Grossly 5/5 throughout. Bulk is normal. Tone is normal  No abnormal movements noted.    Reflex: 2+ throughout UE. Unable to obtain b/l knee and ankle DTRs. Toes down b/l.  No clonus    Sensation: Light touch was grossly intact throughout. Romberg is negative.Vib intact in distal r toe and b/l distal fingers. Vibration absent at L toe.     Coordination: Finger to nose testing was intact without dysmetria.     Gait: Walks with a cane, left leg is everted. Gait appears antalgic    Imaging   My summary based on personal review of images:       Xr Knee 3 View Left    Result Date: 11/30/2017  Osteoarthritic changes, most severe in the medial compartment.  Mitali  Bapna, MD 11/30/2017 10:53 AM    Xr Knee 3  View Right    Result Date: 11/30/2017    Osteoarthritic changes of the knee, worst in the medial compartment. Mitali  Bapna, MD 11/30/2017 10:51 AM    Xr Cervical Spine 4 Or 5 Views    Result Date: 11/30/2017   MULTILEVEL DISC DEGENERATIVE CHANGES. FORAMINAL COMPROMISE ON THE RIGHT AT MULTIPLE LEVELS. GRADE 1 ANTEROLISTHESIS OF C3. Darnelle Maffucci, MD 11/30/2017 10:50 AM    MRI pituitary   INTERPRETATION:  The pituitary gland is normal in contour and signal  characteristics. The gland enhances homogeneously with no focal defects.  The suprasellar cistern is widely patent and the stalk is midline in  position. The optic chiasm is normal in configuration. The cavernous  sinuses and parasellar structures are normal.     IMPRESSION:    Normal MRI of the pituitary gland.    MRI brain  IMPRESSION:      1. No acute intracranial findings.  2. Extensive paranasal sinus inflammatory change, most notably of the  right left maxillary sinus which is completely opacified. An underlying  mass is not excluded.  3. Apparent expansion of the sella turcica without definite mass seen.  Comparison with prior would be helpful; otherwise, this can be further  evaluated with MRI with pituitary protocol.  4. Decreased T1 marrow signal within the upper cervical spine, could  represent sclerosis; however, is indeterminate.  Cervical MR is  recommended for further evaluation.    Labs:  Reviewed  ASSESSMENT / PLAN  57 y.o. year-old morbidly obese  female with currentTOB use p/w 2 year h/o left lateral leg and distal LE numbness.   No focal weakness or atrophy.  Unable to obtain DTRs in LE, question whether this is more related to obesity.   No patholigic DTRs suggesting this is a central process.   She has had an MRI L spine. I asked her to have results faxed to me.     Will send for EMG/NCS to clarify source of her sensory changes.   Proper use and side effects of all medications discussed.   Follow up in 2 mos after EMG      ICD-10-CM    1. Leg  numbness R20.0 EMG   2. Class 3 severe obesity due to excess calories in adult, unspecified BMI, unspecified whether serious comorbidity present E66.01        Orders Placed This Encounter   Procedures   . EMG     Follow up after tests.  Advised the patient to contact us for the test results.  Further plans will be made as test results are available. The patient will call if there are any questions before that. The patient should seek emergent care if there is any change in the symptoms.    Please contact me with any questions. Patients and [facility] Providers can reach me via MyChart.    Fraser Din, MD  [facility]  [place]: [phone]  [place]: [phone]

## 2017-12-12 ENCOUNTER — Inpatient Hospital Stay
Payer: No Typology Code available for payment source | Attending: Anesthesiology | Admitting: Rehabilitative and Restorative Service Providers"

## 2017-12-12 DIAGNOSIS — M6281 Muscle weakness (generalized): Secondary | ICD-10-CM | POA: Insufficient documentation

## 2017-12-12 NOTE — Progress Notes (Signed)
Name: Kristina [name] Age: 57 y.o.   Referring Physician:     Date of Injury: 11/29/2017  Date Care Plan Established/Reviewed: 12/12/2017  Date Treatment Started: 12/12/2017  Visit Count: 1   Diagnosis:   1. Muscle weakness        Subjective     History of Present Illness   Mechanism of injury: Insidious onset  History of Present Illness: Patient reports October 2016 getting a cortisone injection into L knee, numbness seems to have began in the L knee at this time. After some time later the reported numbness began to radiate from L knee into toes, these symptoms are currently constant and she reports "no feeling" in her L knee to toes.She is now experiencing sharp pain in her L hip (points to glut med region), no current symptoms in her R knee. Denies history of falls, no reported falls in past 6 months, is seeing a neurologist some time this week to address current symptoms. Is ambulating with a quad cane, feels "unstable."  Functional Limitations (PLOF): Walking > 1/4 mile (able with no AD)  Reciprocal stair climbing (able to ascend/descend 1 flight of stair to get to apt no knee pain)  Squatting to lift groceries (independent)  Donning / doffing BLE clothing (independent while seated)      Outcome Measure   Tool Used/Details: FOTO  Score: 37  Predicted Functional Outcome: 50    Pain   Current pain rating: 9  At best pain rating: 9  At worst pain rating: 10  Patient Satisfaction with Current Function: 0  Location: L hip, L knee    Social Support/Occupation  Lives in: apartment    Occupation: Not currently working, on disability     Psychologist, [employer]   Sit to Stand: UE assist  Squat: unable  Step-up: 4 inches    Range of Motion        Left AROM    Left PROM   Lumbar/Hip    Right AROM    Right PROM       Lumbar Rotation       supine 30, pain   Hip Flexion supine 90      WFL   Hip Extension        WFL   Hip Abduction        WFL   Hip Adduction       seated 15   Hip IR seated 30     seated 15, pain   Hip ER  seated 30     (blank fields were intentionally left blank)         Left AROM    Left PROM   Knee    Right AROM    Right PROM    50   Flexion  100     lacking 20   Extension lacking 3     (blank fields were intentionally left blank)        Strength        Left Strength  Hip  MMT    Right    3 Hip Flexion  4    4 Hip Extension  4     Hip Abduction       Hip Adduction       Hip IR       Hip ER      3, pain Quadriceps  4    3, pain Hamstrings  4   (blank fields were intentionally  left blank)       Left Strength  Ankle/Foot  MMT    Right    3 Ankle Dorsiflexion  4    3 Ankle Plantarflexion  5     Ankle Inversion       Ankle Eversion     (blank fields were intentionally left blank)    Flexibility > 20 deg of knee flexion on HS 90-90 test bilateral     Tests     Left Knee   Negative anterior drawer, posterior drawer, valgus stress test at 0 degrees and varus stress test at 0 degrees.     Patellar Mobility   Left Knee Patellar tendons within functional limits include the medial and lateral. Hypomobile in the left superior and left inferior patellar tendon(s).     Right Knee Patellar tendons within functional limits include the medial, lateral, superior and inferior.     Neurological Testing     Sensation     Knee   Left Knee   Absent: light touch    Right Knee   Intact: light touch     Reflexes   Left   Patellar (L4): absent (0)  Achilles (S1): absent (0)    Right   Patellar (L4): normal (2+)  Achilles (S1): normal (2+)             Treatment     Therapeutic Exercises   Justification: To increase AROM, strength, endurance to improve ability to achieve goals.  Education with proper use of quad cane, activity modification    Modalities   Hot Pack 10 min. Location L hip Position Supine 90/90  Therapy Rationale: Increase Extensiblility       ---      ---   Total Time   Timed Minutes  15 minutes   Untimed Minutes  30 minutes   Total Time  45 minutes        Assessment   Kristina [name] is a 57 y.o. female presenting with BLE muscle weakness,  intermittent L hip pain, intermittent bilateral knee pain, who requires Physical Therapy for the following:  Impairments: dec hip AROM, dec knee AROM, BLE muscle weakness, inc L hip pain, intermittent BLE knee pain, inc soft tissue restriction, dec gait     Pain located: L hip pain, bilateral knee pain (L > R)    Clinical presentation: evolving   Barriers to therapy: Time since onset of injury/illness/exacerbation original onset of injury 2016, worsening over time  Comorbidities High BMI, HTN, smoker - delayed healing time  Prior Level of Function: Walking > 1/4 mile (able with no AD)  Reciprocal stair climbing (able to ascend/descend 1 flight of stair to get to apt no knee pain)  Squatting to lift groceries (independent)  Donning / doffing BLE clothing (independent while seated)    Prognosis: fair  Plan   Visits per week: 2  Number of Sessions: 12  Direct One on One  16109: Therapeutic Exercise: To Develop Strength and Endurance, ROM and Flexibility  [id number]: Gait Training  60454: Neuromuscular Reeducation  [phone]: Self Care/Home Mgmt Training (ADLs, safety procedures, use of assistive devices)  97140: Manual Therapy techniques (mobilization, manipulation, manual traction)  [id number]: Iontophoresis  97530: Therapeutic Activities: Dynamic activities to improve functional performance  91478: Ultrasound  Dry Needling  97113: Aquatic Therapy  Supervised Modalities  97010: Thermal modalities: hot/cold packs  29562: Mechnical traction  97014: Electrical stimulation  97018: Paraffin bath  97016: Vasopneumatic devices    Goals  Goal 1:  Increase knee flexion AROM to 110 degrees to allow patient to safely negotiate stairs reciprocally with railing.   Sessions:  12      Goal 2:  Inc L knee extension to lacking 5 for inc standing tolerance.    Sessions:  12      Goal 3:  Inc L LE to 4 for inc gait endurance.    Sessions:  12      Goal 4:  Inc L hip IR / ER AROM to 30 for inc independence with donning / doffing LE clothing.    Sessions:  12          Goal 5:  Patient will demonstrate independence in prescribed HEP with proper form, sets and reps for safe discharge to an independent program.   Sessions:  12                         Lyn Hollingshead, PT

## 2017-12-13 ENCOUNTER — Inpatient Hospital Stay: Payer: No Typology Code available for payment source | Attending: Anesthesiology

## 2017-12-13 DIAGNOSIS — M6281 Muscle weakness (generalized): Secondary | ICD-10-CM | POA: Insufficient documentation

## 2017-12-13 NOTE — PT/OT Therapy Note (Signed)
Name: Kristina [name] Age: 57 y.o.   Referring Physician: Cathie Beams, MD   Date of Injury: 11/29/2017  Date Care Plan Established/Reviewed: 12/12/2017  Date Treatment Started: 12/12/2017  Visit Count: 2   Diagnosis:   1. Muscle weakness        Subjective     Pain   Patient states that she was sore after her IE and feels like she is dependent on her R LE. She states that her L LE is numb from the calf down and that she has sharp pain in her L lateral hip.     Social Support/Occupation  Lives in: apartment    Occupation: Not currently working, on disability                 Treatment     Therapeutic Exercises   Justification: To increase neuromotor control of activating TA, glut max, glut med, proximal hip musculature to improve gait mechanics and improve ability to achieve goals. To improve balance and proprioception to reduce risk of falls and injuries.  NuStep- 5' while subjective obtained and equipment set up      Hamstring stretch 2x30"  gastroc stretch 2x30"  Heel slides L 2x10 with strap  Prone quad stretch 2x30"  Ankle 4-way RTB 2x10 ea        Neuromuscular Re-Education   Justification: To increase neuromotor control of activating TA, glut max, glut med, proximal hip musculature to improve gait mechanics and improve ability to achieve goals. To improve balance and proprioception to reduce risk of falls and injuries.  Quad set- training to ensure quad is activated and not glutes/anterior tib   SAQ 2x10  Bridges with ball adduction 2x10  SB hip/knee flexion 2x10    Manual Therapy   Justification: All MT performed to increase tissue extensibility, decrease pain, so patient can perform ADLs with decreased aberrant movement.  STmobs to lateral quad and along ItBand  Patellar mobs  Dtmobs to L peroneal muscles       ---      ---   Total Time   Timed Minutes  45 minutes   Total Time  45 minutes        Assessment   Patient is tender over her L greater trochanter which may be associated with hip bursitis. This is  likely due to gait deviations associated with the numbness in her foot. She has significant weakness in her ankle and requires max encouragement to move her ankle through half the range. Patient is unable to do a quad set on the L in spite of multiple cues and practicing on the R. She is able to perform SAQs showing that she does have quad activation. Motivation may be the limiting factor in PT.   Plan   Continue to progress LE strength and ROM as tolerated.       Goals    Goal 1:  Increase knee flexion AROM to 110 degrees to allow patient to safely negotiate stairs reciprocally with railing.   Sessions:  12      Goal 2:  Inc L knee extension to lacking 5 for inc standing tolerance.    Sessions:  12      Goal 3:  Inc L LE to 4 for inc gait endurance.    Sessions:  12      Goal 4:  Inc L hip IR / ER AROM to 30 for inc independence with donning / doffing LE clothing.   Sessions:  12  Goal 5:  Patient will demonstrate independence in prescribed HEP with proper form, sets and reps for safe discharge to an independent program.   Sessions:  12                         Luna Kitchens, PT

## 2017-12-15 ENCOUNTER — Inpatient Hospital Stay: Payer: No Typology Code available for payment source

## 2017-12-19 ENCOUNTER — Inpatient Hospital Stay
Payer: No Typology Code available for payment source | Attending: Anesthesiology | Admitting: Rehabilitative and Restorative Service Providers"

## 2017-12-19 DIAGNOSIS — M6281 Muscle weakness (generalized): Secondary | ICD-10-CM

## 2017-12-19 NOTE — PT/OT Therapy Note (Signed)
Name: Kristina [name] Age: 57 y.o.   Referring Physician: Cathie Beams, MD   Date of Injury: 11/29/2017  Date Care Plan Established/Reviewed: 12/12/2017  Date Treatment Started: 12/12/2017  Visit Count: 3   Diagnosis:   1. Muscle weakness        Subjective     Social Support/Occupation  Lives in: apartment    Occupation: Not currently working, on disability     Patient says that she has yet to get her n conduction test performed, is attempting to re schedule, feels no sensation in L LE from her knee to toes. Is feeling L hip pain at start of session.        Left AROM 12/19/16   Left PROM   Knee    Right AROM 12/19/16 12/19/16  Right PROM    50 Supine: 40   Flexion  100 Supine: 85 Supine:  100   lacking 20 Supine:   lacking 20  Extension lacking 3 0 0   (blank fields were intentionally left blank)                      Treatment     Therapeutic Exercises   Justification: To increase neuromotor control of activating TA, glut max, glut med, proximal hip musculature to improve gait mechanics and improve ability to achieve goals. To improve balance and proprioception to reduce risk of falls and injuries.  NuStep- 6' while subjective obtained and equipment set up  Slantboard 3x30" heel on ground  Hamstring stretch w/ strap manual assist 3x30"  Heel slides bilateral 2x10 w/ manaul assist        Neuromuscular Re-Education   Justification: To increase neuromotor control of activating TA, glut max, glut med, proximal hip musculature to improve gait mechanics and improve ability to achieve goals. To improve balance and proprioception to reduce risk of falls and injuries.  LAQ x5, 1# x5  SAQ red bolster 2x10  Ball squeeze 20 x 5'  Bridge w/ ball squeeze 2x5  Hooklying clam RTB 2x10    Manual Therapy   Justification: All MT performed to increase tissue extensibility, decrease pain, so patient can perform ADLs with decreased aberrant movement.  STmobs to lateral quad and along ItBand  Patellar mobs  Dtmobs to L  peroneal muscles    Modalities   Hot Pack 10 min. Location L hip Position Supine  Therapy Rationale: Increase Extensiblility       ---      ---   Total Time   Timed Minutes  48 minutes   Total Time  48 minutes        Assessment   Patient continues to have tenderness over her L greater trochanter, this is likely due to gait deviations associated with the numbness in her L foot. Patient able to perform LAQ and SAQ to bilateral LE today, she does however need max cueing for quad recruitment to L LE. Pt demo's reduced knee AROM since IE, measurements today were taken in supine position vs seated which may be the reason why measurements appear to be reduced. Patient says that she feels like she can more easily bend her knee now with less symptoms vs IE.   Plan   Continue to progress LE strength and ROM as tolerated.       Goals    Goal 1:  Increase knee flexion AROM to 110 degrees to allow patient to safely negotiate stairs reciprocally with railing.   Sessions:  12  Goal 2:  Inc L knee extension to lacking 5 for inc standing tolerance.    Sessions:  12      Goal 3:  Inc L LE to 4 for inc gait endurance.    Sessions:  12      Goal 4:  Inc L hip IR / ER AROM to 30 for inc independence with donning / doffing LE clothing.   Sessions:  12          Goal 5:  Patient will demonstrate independence in prescribed HEP with proper form, sets and reps for safe discharge to an independent program.   Sessions:  12                         Lyn Hollingshead, PT

## 2017-12-21 ENCOUNTER — Inpatient Hospital Stay: Payer: No Typology Code available for payment source | Attending: Anesthesiology | Admitting: Physical Therapist

## 2017-12-21 DIAGNOSIS — M6281 Muscle weakness (generalized): Secondary | ICD-10-CM | POA: Insufficient documentation

## 2017-12-21 NOTE — PT/OT Therapy Note (Signed)
Name: Kristina [name] Age: 57 y.o.   Referring Physician: Cathie Beams, MD   Date of Injury: 11/29/2017  Date Care Plan Established/Reviewed: 12/12/2017  Date Treatment Started: 12/12/2017  Visit Count: 4   Diagnosis:   1. Muscle weakness        Subjective     Social Support/Occupation  Lives in: apartment    Occupation: Not currently working, on disability     Pt cont with L lateral leg p! In small window unchanged since initiation of PT. Pt states difficulty lifting legs and amb. Pt inconsistently reports B LE parasthesias.                 Treatment     Therapeutic Exercises   Justification: To increase AROM, strength, endurance to improve ability to achieve goals.    Nu-step x8'  Calf raise 3x10  Slant board 3x30"ea   SKTC B 3x30"ea       Manual Therapy   Justification: All MT performed to increase tissue extensibility, decrease pain, so patient can perform ADLs with decreased aberrant movement.    B long leg distraction and manual TX    Modalities   None  Therapy Rationale:        ---      ---   Total Time   Timed Minutes  45 minutes   Total Time  45 minutes             Left AROM  12/19/16    Left PROM    Knee      Right AROM  12/19/16  12/19/16  Right PROM     50  Supine: 40    Flexion   100  Supine: 85  Supine:  100   lacking 20  Supine:   lacking 20   Extension lacking 3  0  0   (blank fields were intentionally left blank)    Assessment   Pt has limited mobility, activity and therex tolerance due to lbp and apparent weakness. Limited manual tx success given inability to penetrate adiposity. Limited success with LE long distraction and manual TX to lessen p! From parasthesias with limited success reported by pt. Pt required incr time for therex due to decr tolerance with activity or position. Pt demonstrates limited ability to lift LE's L>R with very limited strengthening tolerance.   Plan   Cont with distraction of Lsp, HS stretching and Lsp stretching and intro dural flossing to decr LE parasthesias      Goals     Goal 1:  Increase knee flexion AROM to 110 degrees to allow patient to safely negotiate stairs reciprocally with railing.   Sessions:  12      Goal 2:  Inc L knee extension to lacking 5 for inc standing tolerance.    Sessions:  12      Goal 3:  Inc L LE to 4 for inc gait endurance.    Sessions:  12      Goal 4:  Inc L hip IR / ER AROM to 30 for inc independence with donning / doffing LE clothing.   Sessions:  12          Goal 5:  Patient will demonstrate independence in prescribed HEP with proper form, sets and reps for safe discharge to an independent program.   Sessions:  12  Blaine Hamper, PT

## 2017-12-26 ENCOUNTER — Inpatient Hospital Stay
Payer: No Typology Code available for payment source | Attending: Anesthesiology | Admitting: Rehabilitative and Restorative Service Providers"

## 2017-12-26 DIAGNOSIS — M6281 Muscle weakness (generalized): Secondary | ICD-10-CM | POA: Insufficient documentation

## 2017-12-26 NOTE — PT/OT Therapy Note (Signed)
Name: Kristina [name] Age: 57 y.o.   Referring Physician: Cathie Beams, MD   Date of Injury: 11/29/2017  Date Care Plan Established/Reviewed: 12/12/2017  Date Treatment Started: 12/12/2017  Visit Count: 5   Diagnosis:   1. Muscle weakness        Subjective     Social Support/Occupation  Lives in: apartment    Occupation: Not currently working, on disability     Patient reports falling out of cab after last session, says that her leg "gave way." May have over done it. Still exp R lateral hip pain.                       Treatment     Therapeutic Exercises   Justification: To increase AROM, strength, endurance to improve ability to achieve goals.    Nu-step x8'  Slant board 3x30"ea   W/ intermittent HR 3x10  Standing knee flexion AROM 2x10  Standing quad stretch manual assist x5 x10"   Seating dural flossing x10  Hooklying clam x10   Heel slide manual assist x10     Manual Therapy   Justification: All MT performed to increase tissue extensibility, decrease pain, so patient can perform ADLs with decreased aberrant movement.    Standing quad stretch MET  STM / massage stick to R glut med / TFL / ITB    Modalities   Hot Pack 10 min. Location R hip Position Supine 90/90  Therapy Rationale: Increase Extensiblility       ---      ---   Total Time   Timed Minutes  45 minutes   Total Time  45 minutes             Left AROM  12/19/16    12/26/16    Knee      Right AROM  12/19/16  12/19/16  Right PROM     50  Supine: 40    Flexion   100  Supine: 85  Supine:  100   lacking 20  Supine:   lacking 20   Extension lacking 3  0  0   (blank fields were intentionally left blank)    Assessment   Pt still presents with antalgic gait pattern to R LE, now possibly worsened secondary to most recent fall. Tenderness present over quad / glut med on R. Good response to MHP post session, pt does not have inc pain or soreness leaving today's session.   Plan   Cont with STM R LE, assess goals      Goals    Goal 1:  Increase knee flexion AROM to 110 degrees  to allow patient to safely negotiate stairs reciprocally with railing.   Sessions:  12      Goal 2:  Inc L knee extension to lacking 5 for inc standing tolerance.    Sessions:  12      Goal 3:  Inc L LE to 4 for inc gait endurance.    Sessions:  12      Goal 4:  Inc L hip IR / ER AROM to 30 for inc independence with donning / doffing LE clothing.   Sessions:  12          Goal 5:  Patient will demonstrate independence in prescribed HEP with proper form, sets and reps for safe discharge to an independent program.   Sessions:  12  Anda Latina, PT

## 2017-12-28 ENCOUNTER — Inpatient Hospital Stay: Payer: No Typology Code available for payment source

## 2017-12-30 ENCOUNTER — Inpatient Hospital Stay: Payer: No Typology Code available for payment source

## 2018-01-02 ENCOUNTER — Inpatient Hospital Stay
Payer: No Typology Code available for payment source | Attending: Anesthesiology | Admitting: Rehabilitative and Restorative Service Providers"

## 2018-01-02 DIAGNOSIS — M6281 Muscle weakness (generalized): Secondary | ICD-10-CM | POA: Insufficient documentation

## 2018-01-02 NOTE — PT/OT Therapy Note (Signed)
Name: Kristina [name] Age: 57 y.o.   Referring Physician: Cathie Beams, MD   Date of Injury: 11/29/2017  Date Care Plan Established/Reviewed: 12/12/2017  Date Treatment Started: 12/12/2017  Visit Count: 6   Diagnosis:   1. Muscle weakness        Subjective     Social Support/Occupation  Lives in: apartment    Occupation: Not currently working, on disability     Patient says that she continues to have R lateral hip pain, symptoms have not improved since start of therapy, remained the same. No n. Conduction test performed, scheduled for Friday.                       Treatment     Therapeutic Exercises   Justification: To increase AROM, strength, endurance to improve ability to achieve goals.    Nu-step x8'  Slant board 3x30"ea   Standing knee flexion AROM on step 1 10x5"  Standing quad stretch manual assist x5 x10"   Seating dural flossing x10  Hooklying clam x10   Heel slide manual assist x15 BLE     Neuromuscular Re-Education   Justification: To increase neuromotor control of activating TA, glut max, glut med, proximal hip musculature to improve gait mechanics and improve ability to achieve goals. To improve balance and proprioception to reduce risk of falls and injuries.  Marching // bars x10  3 way hip YTB // bars x10 BLE  HR 3x10  SLR 2x10 (manual assist on L)    Manual Therapy   Justification: All MT performed to increase tissue extensibility, decrease pain, so patient can perform ADLs with decreased aberrant movement.    Seated quad stretch MET  STM / massage stick to R glut med / TFL / ITB    Modalities   Hot Pack 10 min. Location R hip Position Supine 90/90  Therapy Rationale: Increase Extensiblility, patient request        ---      ---   Total Time   Timed Minutes  45 minutes   Total Time  45 minutes             Left AROM  12/19/16    01/02/18  L AROM    Knee      Right AROM  12/19/16  12/19/16  Right PROM 01/02/18  R AROM     50  Supine: 40   Supine: 60 Flexion   100  Supine: 85  Supine:  100 Supine 100   lacking 20   Supine:   lacking 20  Supine lacking 10 Extension lacking 3  0  0    (blank fields were intentionally left blank)    Range of Motion        Left AROM 01/02/18  L AROM   Left PROM   Lumbar/Hip    Right AROM 01/02/18  R AROM   Right PROM       Lumbar Rotation       supine 30, pain   Hip Flexion supine 90      WFL   Hip Extension        [facility]   Hip Abduction        WFL   Hip Adduction       seated 15 30  Hip IR seated 30 30    seated 15, pain 30 p!  Hip ER seated 30 35 p!    (blank fields were intentionally left blank)  Assessment   Pt demonstrates inc hip and knee AROM (see above measurements), aiding in her ability to perform transfers independently in therapy. Continues to have R glut med pain during gait likely associated with numbness / lack of sensation she feels in L LE leading to compensations in R LE. Pt needs assistance during SLR possibly due to continued quad weakness and dec lumbopelvic stabilization. Continue with POC.   Plan   lumbopelvic stabilization      Goals    Goal 1:  Increase knee flexion AROM to 110 degrees to allow patient to safely negotiate stairs reciprocally with railing.   Sessions:  12      Goal 2:  Inc L knee extension to lacking 5 for inc standing tolerance.    Sessions:  12      Goal 3:  Inc L LE to 4 for inc gait endurance.    Sessions:  12      Goal 4:  Inc L hip IR / ER AROM to 30 for inc independence with donning / doffing LE clothing.   Sessions:  12          Goal 5:  Patient will demonstrate independence in prescribed HEP with proper form, sets and reps for safe discharge to an independent program.   Sessions:  12                         Lyn Hollingshead, PT

## 2018-01-05 ENCOUNTER — Inpatient Hospital Stay: Payer: No Typology Code available for payment source

## 2018-01-16 ENCOUNTER — Inpatient Hospital Stay
Payer: No Typology Code available for payment source | Attending: Anesthesiology | Admitting: Rehabilitative and Restorative Service Providers"

## 2018-01-16 DIAGNOSIS — M6281 Muscle weakness (generalized): Secondary | ICD-10-CM

## 2018-01-16 NOTE — PT/OT Therapy Note (Signed)
Name: Kristina [name] Age: 57 y.o.   Referring Physician: Cathie Beams, MD   Date of Injury: 11/29/2017  Date Care Plan Established/Reviewed: 12/12/2017  Date Treatment Started: 12/12/2017  Visit Count: 7   Diagnosis:   1. Muscle weakness        Subjective     Social Support/Occupation  Lives in: apartment    Occupation: Not currently working, on disability     Patient reports sharp L knee pain after EMG study, is still there currently.                       Treatment     Therapeutic Exercises   Justification: To increase AROM, strength, endurance to improve ability to achieve goals.    Nu-step x8'  Slant board 3x30"ea   Standing knee flexion AROM on step 1 10x5"  Standing quad stretch manual assist x5 x10"    Heel slide manual assist x15 BLE   PF with knee extended, DF with knee extended on R GTB, no resistance on L 2x10     Neuromuscular Re-Education   Justification: To increase neuromotor control of activating TA, glut max, glut med, proximal hip musculature to improve gait mechanics and improve ability to achieve goals. To improve balance and proprioception to reduce risk of falls and injuries.  Marching // bars x10  3 way hip YTB // bars x10 BLE  HR 3x10  SLR 2x10 (manual assist on L)  LAQ 1# 2x10 BLE  Hooklying clam x10    Manual Therapy   Justification: All MT performed to increase tissue extensibility, decrease pain, so patient can perform ADLs with decreased aberrant movement.      Modalities   Hot Pack 10 min. Location R hip Position Supine 90/90  Therapy Rationale: Increase Extensiblility, patient request        ---      ---   Total Time   Timed Minutes  45 minutes   Total Time  45 minutes             Left AROM  12/19/16    01/02/18  L AROM    Knee      Right AROM  12/19/16  12/19/16  Right PROM 01/02/18  R AROM     50  Supine: 40   Supine: 60 Flexion   100  Supine: 85  Supine:  100 Supine 100   lacking 20  Supine:   lacking 20  Supine lacking 10 Extension lacking 3  0  0    (blank fields were intentionally left  blank)    Range of Motion        Left AROM 01/02/18  L AROM   Left PROM   Lumbar/Hip    Right AROM 01/02/18  R AROM   Right PROM       Lumbar Rotation       supine 30, pain   Hip Flexion supine 90      WFL   Hip Extension        [facility]   Hip Abduction        WFL   Hip Adduction       seated 15 30  Hip IR seated 30 30    seated 15, pain 30 p!  Hip ER seated 30 35 p!    (blank fields were intentionally left blank)        Assessment   Pt has inc lethargy during exercises, poss due to  cont dec BLE endurance. No exercises reportedly inc sharp pain to posterior knee. Good response to MHP to R hip, advised to ice at home to R knee to help reduce reported sharp pain to R knee.     Goals    Goal 1:  Increase knee flexion AROM to 110 degrees to allow patient to safely negotiate stairs reciprocally with railing.   Sessions:  12      Goal 2:  Inc L knee extension to lacking 5 for inc standing tolerance.    Sessions:  12      Goal 3:  Inc L LE to 4 for inc gait endurance.    Sessions:  12      Goal 4:  Inc L hip IR / ER AROM to 30 for inc independence with donning / doffing LE clothing.   Sessions:  12          Goal 5:  Patient will demonstrate independence in prescribed HEP with proper form, sets and reps for safe discharge to an independent program.   Sessions:  12                         Lyn Hollingshead, PT

## 2018-01-17 ENCOUNTER — Inpatient Hospital Stay: Payer: No Typology Code available for payment source

## 2018-01-18 ENCOUNTER — Inpatient Hospital Stay
Payer: No Typology Code available for payment source | Attending: Anesthesiology | Admitting: Rehabilitative and Restorative Service Providers"

## 2018-01-18 DIAGNOSIS — M6281 Muscle weakness (generalized): Secondary | ICD-10-CM | POA: Insufficient documentation

## 2018-01-18 NOTE — PT/OT Therapy Note (Signed)
Name: Kristina [name] Age: 57 y.o.   Referring Physician: Cathie Beams, MD   Date of Injury: 11/29/2017  Date Care Plan Established/Reviewed: 12/12/2017  Date Treatment Started: 12/12/2017  Visit Count: 8   Diagnosis:   1. Muscle weakness        Subjective     Social Support/Occupation  Lives in: apartment    Occupation: Not currently working, on disability     Patient reports continued sharp knee pain, is a 9-10/10 today. Still feels able to do light exercise.                       Treatment     Therapeutic Exercises   Justification: To increase AROM, strength, endurance to improve ability to achieve goals.    Minimal cueing to ensure pt independent with HEP.     Neuromuscular Re-Education   Justification: To increase neuromotor control of activating TA, glut max, glut med, proximal hip musculature to improve gait mechanics and improve ability to achieve goals. To improve balance and proprioception to reduce risk of falls and injuries.    Manual Therapy   Justification: All MT performed to increase tissue extensibility, decrease pain, so patient can perform ADLs with decreased aberrant movement.      Modalities   Hot Pack 10 min. Location R hip Position Supine 90/90  Therapy Rationale: Increase Extensiblility, patient request        ---      ---   Total Time   Timed Minutes  45 minutes   Total Time  45 minutes             Left AROM  12/19/16    01/02/18  L AROM 01/18/18  L AROM    Knee      Right AROM  12/19/16  12/19/16  Right PROM 01/02/18  R AROM 01/18/18  R AROM     50  Supine: 40   Supine: 60 40 Flexion   100  Supine: 85  Supine:  100 Supine 100 80   lacking 20  Supine:   lacking 20  Supine lacking 10 Lacking 10  Extension lacking 3  0  0  0   (blank fields were intentionally left blank)    Range of Motion        Left AROM 01/02/18  L AROM   Lumbar/Hip    Right AROM 01/02/18  R AROM      Lumbar Rotation      supine 30, pain  Hip Flexion supine 90     WFL  Hip Extension       [facility]  Hip Abduction        WFL  Hip Adduction      seated 15 30 Hip IR seated 30 30   seated 15, pain 30 p! Hip ER seated 30 35 p!   (blank fields were intentionally left blank)    Strength     L  01/18/18  L   Left Strength  Hip  MMT R 01/18/18     Right   3+ 3 Hip Flexion  4   4 4  Hip Extension  4     Hip Abduction       Hip Adduction       Hip IR       Hip ER     3+ p! 3, pain Quadriceps 4 4   3+ p! 3, pain Hamstrings 4 4   (blank fields were  intentionally left blank)    01/18/18  L   Left Strength  Ankle/Foot  MMT R   Right   3 3 Ankle Dorsiflexion 4 4   3 3  Ankle Plantarflexion 5 5     Ankle Inversion       Ankle Eversion     (blank fields were intentionally left blank)        Assessment   Pt has minimal questions with exercises that are apart of her HEP. She has continued sharp L knee pain, therapy has not seemed to help reduce painful symptoms.Patient continues to have tenderness over her R greater trochanter which may be associated with hip bursitis. This is likely due to gait deviations associated with the numbness in her L foot. Her AROM has improved some, no significant strength improvements noted. These findings suggest minimal effectiveness of skilled therapy services, she is to f/u with physician to discuss further tx options. [place] to HEP which is AROM based exercises, pt is to cease performing them should her symptoms worsen.  Plan   [place] to HEP.       Goals    Goal 1:  Increase knee flexion AROM to 110 degrees to allow patient to safely negotiate stairs reciprocally with railing.   Sessions:  12      Goal 2:  Inc L knee extension to lacking 5 for inc standing tolerance.    Sessions:  12      Goal 3:  Inc L LE to 4 for inc gait endurance.    Sessions:  12      Goal 4:  Inc L hip IR / ER AROM to 30 for inc independence with donning / doffing LE clothing.   Sessions:  12          Goal 5:  Patient will demonstrate independence in prescribed HEP with proper form, sets and reps for  safe discharge to an independent program.   Sessions:  12                         Lyn Hollingshead, PT

## 2018-01-19 ENCOUNTER — Other Ambulatory Visit: Payer: Self-pay | Admitting: MOHS-Micrographic Surgery

## 2018-01-19 ENCOUNTER — Ambulatory Visit
Admission: RE | Admit: 2018-01-19 | Discharge: 2018-01-19 | Disposition: A | Discharge status: Home / Self Care | Payer: No Typology Code available for payment source | Source: Ambulatory Visit | Attending: Dermatology | Admitting: Dermatology

## 2018-01-19 ENCOUNTER — Ambulatory Visit
Admission: RE | Admit: 2018-01-19 | Discharge: 2018-01-19 | Disposition: A | Discharge status: Home / Self Care | Payer: No Typology Code available for payment source | Source: Ambulatory Visit | Attending: MOHS-Micrographic Surgery | Admitting: MOHS-Micrographic Surgery

## 2018-01-19 DIAGNOSIS — D863 Sarcoidosis of skin: Secondary | ICD-10-CM | POA: Insufficient documentation

## 2018-02-10 ENCOUNTER — Ambulatory Visit (INDEPENDENT_AMBULATORY_CARE_PROVIDER_SITE_OTHER): Payer: No Typology Code available for payment source | Admitting: No Specialty

## 2018-02-10 ENCOUNTER — Encounter (INDEPENDENT_AMBULATORY_CARE_PROVIDER_SITE_OTHER): Payer: Self-pay | Admitting: No Specialty

## 2018-02-10 VITALS — BP 131/83 | HR 80 | Ht 63.0 in | Wt 200.0 lb

## 2018-02-10 DIAGNOSIS — Z713 Dietary counseling and surveillance: Secondary | ICD-10-CM

## 2018-02-10 DIAGNOSIS — R200 Anesthesia of skin: Secondary | ICD-10-CM

## 2018-02-10 DIAGNOSIS — R2689 Other abnormalities of gait and mobility: Secondary | ICD-10-CM

## 2018-02-10 NOTE — Progress Notes (Signed)
CC: numbness    HPI:   History was obtained from patient     No new bowel or bladder problems.  She still has balance problems. Walks with a cane.  She is very claustrophobic and has cocnerns about repeating the MRI.     EMG shows chronic S1 radiculopathy. She has no improvement in her low back or left LE radicular pain.     Retained From Initial Consultation:     57 y.o. year-old female with numbness since 2016.  It is confined to the left lower extremity in the lateral aspect from the thigh to the entire leg distal to the calf.  The numbness is constant.  The numbness is not painful.  She also feels like her left leg is "weak" and "flaps".  She can walk up and downstairs with a cane.  No low back pain.  No right LE numbness or weakness.  Also no numbness or weakness in UE or neck pain.  No bowel  or bladder inconitnence or saddle paresthesias.   Only possible trigger she can think of is getting a cortisone injection in the knee around the time of symptoms onset.      She was seen previously by a neurologist for this.  MRI L spine was done, EMG/NCS was not done.   Review of Previous records reveals:  Ortho note Aug 2018 "As 1st left leg and foot numbness concerned, she was advised continue the see neurologist since this is most likely nerve related symptoms and possibly from lumbar sacral discogenic problem. For the bilateral knee pain, I explained her about more invasive optional treatment. Since patient does want have injection treatment at the present time, The patient was advised to utilize Diclofenac-XR 100 milligram once a day after meals only as needed. I also discussed about the weight which is contributing significantly to her bilateral knee problem. She was advised to return to my office in 6 months for reexamination and x-ray study of the bilateral standing Zoila Shutter views."    Past Medical, Surgical, Social, Family History: Per Epic. Reviewed and updated.     Med / Surg  Past Medical History:    Diagnosis Date   . Arthritis    . Depression    . Difficulty walking     walks with cane   . Low back pain     herniated disc   . Multiple thyroid nodules     noted 2013 admission   . Neuromyopathy     toes numb   . Seasonal allergic rhinitis    . Shortness of breath        Soc  Social History     Social History   . Marital status: Legally Separated     Spouse name: N/A   . Number of children: N/A   . Years of education: N/A     Occupational History   . Not on file.     Social History Main Topics   . Smoking status: Current Every Day Smoker     Packs/day: 4.00     Years: 40.00     Types: Cigarettes   . Smokeless tobacco: Never Used   . Alcohol use No   . Drug use: No   . Sexual activity: Not on file     Other Topics Concern   . Not on file     Social History Narrative   . No narrative on file       Fam-   Noncontributory  Family History   Problem Relation Age of Onset   . Heart disease Mother    . Heart disease Brother    . Heart disease Sister    . Kidney disease Father    . Heart disease Father    . Kidney disease Son         deceased    . Heart disease Son    . Malignant hyperthermia Neg Hx    . Pseudochol deficiency Neg Hx    . Breast cancer Neg Hx        Review of Systems   Musculoskeletal: Negative for back pain and neck pain.   Neurological: Positive for tingling and sensory change.   All other systems reviewed and are negative.        Meds:  Current Outpatient Prescriptions on File Prior to Visit   Medication Sig Dispense Refill   . cetirizine (ZYRTEC) 10 MG tablet Take 1 tablet (10 mg total) by mouth daily. 90 tablet 0   . diclofenac sodium (VOLTAREN) 1 % Gel topical gel Apply topically 4 (four) times daily.     Marland Kitchen ESCITALOPRAM OXALATE PO Take by mouth.     . furosemide (LASIX) 40 MG tablet Take 1 tablet (40 mg total) by mouth daily. 30 tablet 3   . ibuprofen (ADVIL,MOTRIN) 600 MG tablet Take 600 mg by mouth every 6 (six) hours as needed for Pain.     . traZODone (DESYREL) 50 MG tablet Take 50 mg by Mouth  Every Night at Bedtime.       No current facility-administered medications on file prior to visit.        EXAM:  Visit Vitals  BP 131/83 (BP Site: Right arm, Patient Position: Sitting, Cuff Size: Large)   Pulse 80   Ht 1.6 m (5\' 3" )   Wt 90.7 kg (200 lb)   BMI 35.43 kg/m     Respiratory rate normal    General: Morbidly obese F in no acute distress. There is no icterus or cyanosis or pedal edema.    Psychiatric. Cooperative. Thought content and process normal. Mood good. Affect congruent.     Mental Status: The patient was awake, alert, appeared oriented and was appropriate with examiner. Language is fluent and the patient followed commands well. Attention, concentration, and memory appeared intact. Fund of knowledge appeared appropriate for education level.     Cranial Nerves:  Extraocular movements were intact without nystagmus. Light touch was intact V1-3.Face was symmetric with normal strength. Hearing was intact to conversational speech.  Speech without hypophonia or dysarthria. Palate volitionally rises symmetrically. Tongue appeared midline with no atrophy.     Motor: Grossly 5/5 throughout. Bulk is normal. Tone is normal  No abnormal movements noted.    Reflex: 2+ throughout UE. Unable to obtain b/l knee and ankle DTRs. Toes down b/l.  No clonus    Sensation: Light touch was grossly intact throughout. Romberg is negative.Vib intact in distal r toe and b/l distal fingers. Vibration absent at L toe.     Coordination: No truncal ataxia    Gait: Walks with a cane, left leg is everted. Gait appears antalgic    Imaging   My summary based on personal review of images:       Xr Chest 2 Views    Result Date: 01/19/2018   No active disease is seen in the chest. Laurena Slimmer, MD 01/19/2018 10:41 AM    MRI pituitary   INTERPRETATION:  The pituitary gland  is normal in contour and signal  characteristics. The gland enhances homogeneously with no focal defects.  The suprasellar cistern is widely patent and the stalk is midline  in  position. The optic chiasm is normal in configuration. The cavernous  sinuses and parasellar structures are normal.     IMPRESSION:    Normal MRI of the pituitary gland.    MRI brain  IMPRESSION:      1. No acute intracranial findings.  2. Extensive paranasal sinus inflammatory change, most notably of the  right left maxillary sinus which is completely opacified. An underlying  mass is not excluded.  3. Apparent expansion of the sella turcica without definite mass seen.  Comparison with prior would be helpful; otherwise, this can be further  evaluated with MRI with pituitary protocol.  4. Decreased T1 marrow signal within the upper cervical spine, could  represent sclerosis; however, is indeterminate.  Cervical MR is  recommended for further evaluation.    Labs:  Reviewed  ASSESSMENT / PLAN  58 y.o. year-old morbidly obese  female with current TOB use p/w 2 year h/o left lateral leg and distal LE numbness.   No focal weakness or atrophy.  Unable to obtain DTRs in LE, question whether this is more related to obesity.   No patholigic DTRs suggesting this is a central process.   She has had an MRI L spine. I asked her to have results faxed to me.   EMG/NCS shows chronic left S1 radiculopathy.  This would not solely explain her gait impairment.     Will order T spine with OPEN MRI due to claustrophobia. She declines benzo or benadryl    She has already been through PT and Pain management and declines renewed referrals    We discussed weight loss    Proper use and side effects of all medications discussed.   Follow up in 2 mos after MRI T spine      ICD-10-CM    1. Abnormality of gait due to impairment of balance R26.89 MRI Thoracic Spine WO Contrast   2. Leg numbness R20.0    3. Class 3 severe obesity due to excess calories in adult, unspecified BMI, unspecified whether serious comorbidity present E66.01    4. Encounter for weight loss counseling Z71.3        Orders Placed This Encounter   Procedures   . MRI  Thoracic Spine WO Contrast     Follow up after tests.  Advised the patient to contact us for the test results.  Further plans will be made as test results are available. The patient will call if there are any questions before that. The patient should seek emergent care if there is any change in the symptoms.    Please contact me with any questions. Patients and [place] Providers can reach me via MyChart.    Fraser Din, MD  [facility]  [place]: [phone]  [place] Pai: [phone]

## 2018-02-10 NOTE — Patient Instructions (Addendum)
MRI thoracic spine (open MRI)    Authorization to [facility] for MRI L spine results    F/up in 8 weeks

## 2018-02-21 ENCOUNTER — Other Ambulatory Visit: Payer: Self-pay | Admitting: Family Medicine

## 2018-02-27 ENCOUNTER — Other Ambulatory Visit: Payer: Self-pay | Admitting: Family Medicine

## 2018-02-27 ENCOUNTER — Ambulatory Visit
Admission: RE | Admit: 2018-02-27 | Discharge: 2018-02-27 | Disposition: A | Discharge status: Home / Self Care | Payer: No Typology Code available for payment source | Source: Ambulatory Visit | Attending: Family Medicine | Admitting: Family Medicine

## 2018-02-27 DIAGNOSIS — F17200 Nicotine dependence, unspecified, uncomplicated: Secondary | ICD-10-CM | POA: Insufficient documentation

## 2018-02-27 DIAGNOSIS — Z122 Encounter for screening for malignant neoplasm of respiratory organs: Secondary | ICD-10-CM

## 2018-02-27 DIAGNOSIS — Z136 Encounter for screening for cardiovascular disorders: Secondary | ICD-10-CM

## 2018-03-08 ENCOUNTER — Other Ambulatory Visit: Payer: Self-pay | Admitting: Family Medicine

## 2018-03-08 ENCOUNTER — Ambulatory Visit
Admission: RE | Admit: 2018-03-08 | Discharge: 2018-03-08 | Disposition: A | Discharge status: Home / Self Care | Payer: No Typology Code available for payment source | Source: Ambulatory Visit | Attending: Family Medicine | Admitting: Family Medicine

## 2018-03-08 DIAGNOSIS — Z136 Encounter for screening for cardiovascular disorders: Secondary | ICD-10-CM | POA: Insufficient documentation

## 2018-03-08 DIAGNOSIS — Z122 Encounter for screening for malignant neoplasm of respiratory organs: Secondary | ICD-10-CM

## 2018-03-08 DIAGNOSIS — Z87891 Personal history of nicotine dependence: Secondary | ICD-10-CM | POA: Insufficient documentation

## 2018-04-21 ENCOUNTER — Ambulatory Visit
Admission: RE | Admit: 2018-04-21 | Discharge: 2018-04-21 | Disposition: A | Discharge status: Home / Self Care | Payer: No Typology Code available for payment source | Source: Ambulatory Visit | Attending: Family Medicine | Admitting: Family Medicine

## 2018-04-21 ENCOUNTER — Other Ambulatory Visit: Payer: Self-pay | Admitting: Family Medicine

## 2018-04-21 DIAGNOSIS — Z1239 Encounter for other screening for malignant neoplasm of breast: Secondary | ICD-10-CM

## 2018-04-21 DIAGNOSIS — Z1231 Encounter for screening mammogram for malignant neoplasm of breast: Secondary | ICD-10-CM | POA: Insufficient documentation

## 2018-05-26 ENCOUNTER — Other Ambulatory Visit: Payer: Self-pay | Admitting: Family Medicine

## 2018-05-26 ENCOUNTER — Ambulatory Visit
Admission: RE | Admit: 2018-05-26 | Discharge: 2018-05-26 | Disposition: A | Discharge status: Home / Self Care | Payer: No Typology Code available for payment source | Source: Ambulatory Visit | Attending: Family Medicine | Admitting: Family Medicine

## 2018-05-26 DIAGNOSIS — G8929 Other chronic pain: Secondary | ICD-10-CM

## 2018-05-26 DIAGNOSIS — M79672 Pain in left foot: Secondary | ICD-10-CM | POA: Insufficient documentation

## 2018-05-31 ENCOUNTER — Encounter (INDEPENDENT_AMBULATORY_CARE_PROVIDER_SITE_OTHER): Payer: Self-pay | Admitting: Nurse Practitioner

## 2018-05-31 ENCOUNTER — Ambulatory Visit (INDEPENDENT_AMBULATORY_CARE_PROVIDER_SITE_OTHER): Payer: No Typology Code available for payment source | Admitting: Nurse Practitioner

## 2018-05-31 VITALS — BP 131/81 | HR 83 | Ht 63.0 in | Wt 210.0 lb

## 2018-05-31 DIAGNOSIS — R269 Unspecified abnormalities of gait and mobility: Secondary | ICD-10-CM

## 2018-05-31 DIAGNOSIS — R200 Anesthesia of skin: Secondary | ICD-10-CM

## 2018-06-02 NOTE — Progress Notes (Signed)
Subjective:      Patient ID: Kristina [name] is a 57 y.o. female    HPI  She presents to the office for a follow up. Her last visit was 02/10/2018 with Dr. Thayer Jew. She is a 57 yo female here for follow up of left arm and leg numbness and tremor since 2016. She tells me that she had an injection in her left knee in 2016 when the events started in her left leg. She notes that about 3 days after the injections that she had whole body shaking. She has numbness in the toes of her left foot and her left upper leg. The numbness is constant, but is not painful. She gets shaking episodes on her left leg and arm that last for about 10-15 minutes. She had an EMG which showed Chronic S1 Radiculopathy. She was to get an open MRI of there Thoracic Spine done, but was unable to complete this due to claustrophobia. She was told to ask her doctor about another option.     She was seen by pain management and had done PT. She defers further PT and says that she will got to the gym on her own and work with a Psychologist, educational. She was seen by Ortho in August 2018 last.       The following portions of the patient's history were reviewed and updated as appropriate: allergies, current medications, past family history, past medical history, past social history, past surgical history and problem list.      Review of Systems  The patient denies loss of consciousness or syncope. There is no chest pain or shortness of breath. All systems were reviewed and were negative except as described in the HPI.  Active Ambulatory Problems     Diagnosis Date Noted   . Multiple thyroid nodules 08/10/2012   . Second degree atrioventricular block, Mobitz (type) I 01/18/2014   . Allergic rhinitis 01/18/2014   . Hyperlipidemia 01/19/2014   . Morbid obesity 01/19/2014   . Bilateral lower extremity edema 01/19/2014   . Cigarette smoker one half pack a day or less 04/11/2017   . Muscle weakness 12/12/2017     Resolved Ambulatory Problems     Diagnosis Date Noted   . Tingling in  extremities 08/10/2012   . Chest pain 01/18/2014   . Dyspnea on exertion 01/18/2014     Past Medical History:   Diagnosis Date   . Arthritis    . Depression    . Difficulty walking    . Low back pain    . Multiple thyroid nodules    . Neuromyopathy    . Seasonal allergic rhinitis    . Shortness of breath      Patient Active Problem List   Diagnosis   . Multiple thyroid nodules   . Second degree atrioventricular block, Mobitz (type) I   . Allergic rhinitis   . Hyperlipidemia   . Morbid obesity   . Bilateral lower extremity edema   . Cigarette smoker one half pack a day or less   . Muscle weakness       Objective:   Neurologic Exam  BP 131/81 (BP Site: Left arm, Patient Position: Sitting, Cuff Size: Large)   Pulse 83   Ht 1.6 m (5\' 3" )   Wt 95.3 kg (210 lb)   BMI 37.20 kg/m   The patient is well developed and well nourished, sitting comfortably in the exam room in no acute distress. Patient is awake and alert. Speech  is normal without aphasia. Cognitive function is intact, oriented times three. Recent and remote memory are intact.  Cranial nerves reveal that pupils are equally reactive to light. Extraocular muscles are intact with conjugate movements. There is no ptosis. The face is symmetric. There is no sensory loss in the face. The tongue is midline.  Motor exam is intact with equal strength bilaterally. Muscle tone and bulk are normal. There are no fasciculations and no atrophy. Myotatic reflexes are 2+ throughout UE. Unable to obtain b/l knee and ankle DTRs.  Sensation is intact in all extremities to pinprick and fine tactile stimulation. She has decreased sensation on LUE and decreased sensation and numbness on left upper leg.  Coordination is intact for rapid alternating movements, finger to nose and heel to shin. There is no dysmetria .Ambulates with a cane, gait unsteady.          Assessment:     1) Gait abnormality  2) Left leg numbness  3) Left arm numbness  4) Obesity      Plan:     1) She defers open  MRI of Thoracic Spine due to claustrophobia, she had declined benzo and benadryl.   2) We discussed PT, she tells me she has done this already and declines PT  3) She declines pain management  4) She declines medications at this time, states "I do not want to take more pills"  5) RTC prn       Kathaleen Grinder, NP-C  Nurse Practitioner  [facility]- Neurology  Phone- [phone]  Fax- [phone]

## 2018-08-01 ENCOUNTER — Encounter (HOSPITAL_BASED_OUTPATIENT_CLINIC_OR_DEPARTMENT_OTHER): Payer: Self-pay
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
## 2018-08-08 ENCOUNTER — Ambulatory Visit (INDEPENDENT_AMBULATORY_CARE_PROVIDER_SITE_OTHER): Payer: No Typology Code available for payment source

## 2019-01-11 NOTE — Progress Notes (Signed)
Name:Kristina [name] Age: 58 y.o.   Date of Service: 01/18/2018  Referring Physician: Cathie Beams, MD   Date of Injury: 11/29/2017  Date Care Plan Established/Reviewed: 12/12/2017  Date Treatment Started: 12/12/2017  End of Certification Date: 03/11/2018  Sessions in Plan of Care: 12  Surgery Date: No data was found      Visit Count: 8   Diagnosis:   1. Muscle weakness          Discontinuation of Therapy Services    Kristina [name] did not complete prescribed physical therapy visits.      Status is unknown at this time, physical therapy has been discontinued and patient has been discharged from care.  The last therapy note is below for review.    Please feel free to contact me with any questions regarding the care of Kristina [name].    Sincerely,    Autumn Patty, DPT [place] [phone]        01/11/2019            Precautions: depression/anxiety  osteopenia/osteoporosis  Allergies: Tramadol and Pollen extract    Past Medical History:   Diagnosis Date   . Arthritis    . Depression    . Difficulty walking     walks with cane   . Low back pain     herniated disc   . Multiple thyroid nodules     noted 2013 admission   . Neuromyopathy     toes numb   . Seasonal allergic rhinitis    . Shortness of breath                          ---      ---   Total Time   Timed Minutes  45 minutes   Total Time  45 minutes           Goals    Goal 1:  Increase knee flexion AROM to 110 degrees to allow patient to safely negotiate stairs reciprocally with railing.   Sessions:  12      Goal 2:  Inc L knee extension to lacking 5 for inc standing tolerance.    Sessions:  12      Goal 3:  Inc L LE to 4 for inc gait endurance.    Sessions:  12      Goal 4:  Inc L hip IR / ER AROM to 30 for inc independence with donning / doffing LE clothing.   Sessions:  12          Goal 5:  Patient will demonstrate independence in prescribed HEP with proper form, sets and reps for safe discharge to an independent program.   Sessions:  12                         Sherren Mocha, PT

## 2020-01-15 ENCOUNTER — Other Ambulatory Visit: Payer: Self-pay | Admitting: Otolaryngology

## 2020-01-15 ENCOUNTER — Ambulatory Visit
Admission: RE | Admit: 2020-01-15 | Discharge: 2020-01-15 | Disposition: A | Discharge status: Home / Self Care | Payer: No Typology Code available for payment source | Attending: Otolaryngology | Admitting: Otolaryngology

## 2020-01-15 DIAGNOSIS — Z5321 Procedure and treatment not carried out due to patient leaving prior to being seen by health care provider: Secondary | ICD-10-CM | POA: Insufficient documentation

## 2020-01-15 DIAGNOSIS — E041 Nontoxic single thyroid nodule: Secondary | ICD-10-CM

## 2020-01-22 ENCOUNTER — Ambulatory Visit: Payer: No Typology Code available for payment source

## 2020-01-25 ENCOUNTER — Ambulatory Visit
Admission: RE | Admit: 2020-01-25 | Discharge: 2020-01-25 | Disposition: A | Discharge status: Home / Self Care | Payer: No Typology Code available for payment source | Source: Ambulatory Visit | Attending: Otolaryngology | Admitting: Otolaryngology

## 2020-01-25 DIAGNOSIS — E042 Nontoxic multinodular goiter: Secondary | ICD-10-CM | POA: Insufficient documentation

## 2020-01-25 DIAGNOSIS — E041 Nontoxic single thyroid nodule: Secondary | ICD-10-CM

## 2020-01-25 LAB — T4, FREE: T4 Free: 0.89 ng/dL (ref 0.70–1.48)

## 2020-01-25 LAB — TSH: TSH: 1.33 u[IU]/mL (ref 0.35–4.94)

## 2020-03-03 ENCOUNTER — Encounter (INDEPENDENT_AMBULATORY_CARE_PROVIDER_SITE_OTHER): Payer: Self-pay | Admitting: No Specialty

## 2020-04-25 ENCOUNTER — Other Ambulatory Visit: Payer: Self-pay | Admitting: Family Medicine

## 2020-04-25 DIAGNOSIS — Z1231 Encounter for screening mammogram for malignant neoplasm of breast: Secondary | ICD-10-CM

## 2020-05-02 ENCOUNTER — Encounter (INDEPENDENT_AMBULATORY_CARE_PROVIDER_SITE_OTHER): Payer: Self-pay | Admitting: Cardiovascular Disease

## 2020-05-02 ENCOUNTER — Ambulatory Visit (INDEPENDENT_AMBULATORY_CARE_PROVIDER_SITE_OTHER): Payer: No Typology Code available for payment source | Admitting: Cardiovascular Disease

## 2020-05-02 ENCOUNTER — Encounter (INDEPENDENT_AMBULATORY_CARE_PROVIDER_SITE_OTHER): Payer: Self-pay

## 2020-05-02 VITALS — BP 143/85 | HR 93 | Resp 16 | Ht 63.0 in | Wt 269.0 lb

## 2020-05-02 DIAGNOSIS — I441 Atrioventricular block, second degree: Secondary | ICD-10-CM

## 2020-05-02 NOTE — Progress Notes (Signed)
CARDIOLOGY OFFICE NOTE     Date:  05/02/2020      Kristina [name]  DOB: [birth year]  MRN: [id number]    PCP: Kristina Plummer, MD    PROBLEM LIST     1. Smoking  2. BMI 47    HISTORY OF PRESENT ILLNESS     I had the pleasure of seeing Kristina [name] in the cardiology office today.  She is a 59 y.o. female with the above medical history here for CV preventative evaluation.    She reports feeling well. She has some exertional limitation due to hip/thigh problem and uses a cane. She walks from her place to the parking lot and back to her place once or twice regularly. She is interested in losing weight at this point. No family history of premature CAD.    She reports no exertional chest discomfort.  She denies exertional dyspnea.  Denies orthopnea or PND. No peripheral edema. No palpitations, lightheadedness or syncope.       REVIEW OF SYSTEMS   All other systems are negative.    MEDICATIONS/ALLERGY     Current Outpatient Medications   Medication Sig Dispense Refill   . atorvastatin (LIPITOR) 20 MG tablet Take 20 mg by mouth daily     . diclofenac sodium (VOLTAREN) 1 % Gel topical gel Apply topically 4 (four) times daily.     Marland Kitchen ESCITALOPRAM OXALATE PO Take 10 mg by mouth daily        . furosemide (LASIX) 40 MG tablet Take 1 tablet (40 mg total) by mouth daily. 30 tablet 3   . meclizine (ANTIVERT) 25 MG tablet Take 25 mg by mouth every 8 (eight) hours as needed     . oxyCODONE-acetaminophen (PERCOCET) 10-325 MG per tablet TAKE 1/2 TABLET EVERY 6 HOURS AS NEEDED FOR SEVERE PAIN     . traZODone (DESYREL) 50 MG tablet as needed        . cetirizine (ZYRTEC) 10 MG tablet Take 1 tablet (10 mg total) by mouth daily. 90 tablet 0   . ibuprofen (ADVIL,MOTRIN) 600 MG tablet Take 600 mg by mouth every 6 (six) hours as needed for Pain.        Allergies   Allergen Reactions   . Tramadol Rash   . Pollen Extract Hives       PAST MEDICAL/SURGICAL HISTORY     Past Medical History:   Diagnosis Date   . Arthritis    . Depression    . Difficulty  walking     walks with cane   . Low back pain     herniated disc   . Multiple thyroid nodules     noted 2013 admission   . Neuromyopathy     toes numb   . Seasonal allergic rhinitis    . Shortness of breath      Past Surgical History:   Procedure Laterality Date   . AX PAIN CLINIC REQUEST Left 11/22/2016    Procedure: AX PAIN CLINIC REQUEST;  Surgeon: Caro Laroche, MD;  Location: [facility];  Service: Anesthesiology;  Laterality: Left;  NEW PT - WILL FAX ORDER BEFORE ARRIVAL..VP       SOCIAL HISTORY     Social History     Tobacco Use   . Smoking status: Current Every Day Smoker     Packs/day: 4.00     Years: 40.00     Pack years: 160.00     Types: Cigarettes   . Smokeless tobacco: Never  Used   Substance Use Topics   . Alcohol use: No       FAMILY HISTORY   family history includes Heart disease in her brother, father, mother, sister, and son; Kidney disease in her father and son.    PHYSICAL EXAM   Blood pressure 143/85, pulse 93, resp. rate 16, height 1.6 m (5\' 3" ), weight 122 kg (269 lb), SpO2 96 %.  @VSRANGES @  BP Readings from Last 3 Encounters:   05/02/20 143/85   05/31/18 131/81   02/10/18 131/83     Wt Readings from Last 3 Encounters:   05/02/20 122 kg (269 lb)   05/31/18 95.3 kg (210 lb)   02/10/18 90.7 kg (200 lb)      Body mass index is 47.65 kg/m.   General: no distress  Head: NC/AT  Eyes: no conjunctival pallor, no icterus   ENMT: MMM, no oral ulcers  Neck: No thyromegaly  Respiratory: clear to auscultation, no rales or wheezes  Cardiovascular: no JVD, no carotid bruits. normal rate, regular rhythm, normal S1 and S2; no heaves or lifts; no murmurs, no gallops or rubs; PMI non-displaced   GI: soft, non-tender  Ext: warm, no edema, no cyanosis   Skin: warm and dry, no rashes.   Neuro/Psych: alert and oriented x 3, normal affect, no focal deficits       LAB RESULTS     Basic Metabolic Profile   Lab Results   Component Value Date    NA 141 01/18/2014    K 4.3 01/18/2014    BUN 10 01/18/2014    CREAT 0.7  01/18/2014    CA 9.1 01/18/2014    GLU 88 01/18/2014         Cardiac Biomarkers   Lab Results   Component Value Date    BNP 37 01/18/2014        CBC with Diff   Lab Results   Component Value Date    WBC 5.85 01/18/2014    HGB 11.9 (L) 01/18/2014    HCT 36.8 (L) 01/18/2014    PLT 258 01/18/2014         Cholesterol Panel   Lab Results   Component Value Date    CHOL 166 01/19/2014    HDL 30 (L) 01/19/2014    LDL 118 (H) 01/19/2014    TRIG 92 01/19/2014         Endocrine   Lab Results   Component Value Date    HGBA1C 5.8 01/19/2014    HGBA1C 5.8 08/08/2012    TSH 1.33 01/25/2020         Coagulation Studies   Lab Results   Component Value Date    DDIMER 0.47 01/18/2014         CARDIAC DIAGNOSTICS   HISTO  ECG (indepedently reviewed by me):  05/02/20 - SR, LAD, low voltage QRS, no signif change from prior    Echocardiogram: 04/22/17  1. Normal left ventricular size and systolic function,   estimated EF 55 to 60%                 2. Mild mid anteroseptal hypokinesis                 3. Normal diastolic function                 4. No significant valvular abnormality                 5. Aneurysmal atrial septum  with no evidence of shunt.    AAA Screening US 03/08/18  No evidence of abdominal aortic aneurysm with a normal  caliber widely patent aorta noted.     I have reviewed the above studies/reports.  ORY   ASSESSMENT AND PLAN     Preventative visit-patient reports doing well clinically and is interested in cardiovascular prevention  - Discussed need for weight control (BMI 47.6), increasing exercise and smoking cessation.  - Patient plans to lose 15 to 20 pounds by next visit.     Elevated BP-no prior diagnosis of HTN.  - Discussed with patient to increase activity, decrease sodium intake and work towards weight loss as she is planning  - She plans to get a BP machine. Have encouraged her to keep a BP log and bring it to her doctor's visits    Smoking-smoking cessation education provided    Orders Placed This Encounter    Procedures   . ECG 12 lead      Return in about 6 months (around 11/02/2020).  _____________________________    Alinda Money, MD, MPH, [facility]  Cardiology - [facility]    Tel.: [phone], Fax: [phone]  [address], [place], [place] [address]  [address], [place], [place] [address]  [address], [place], [place] [address]  _____________________________    This note was generated by the Epic EMR system/Dragon speech recognition and may contain inherent errors or omissions not intended by the user. Grammatical errors, random word insertions, deletions and pronoun errors  are occasional consequences of this technology due to software limitations. Not all errors are caught or corrected. If there are questions or concerns about the content of this note or information contained within the body of this dictation they should be addressed directly with the author for clarification.

## 2020-05-27 ENCOUNTER — Ambulatory Visit
Admission: RE | Admit: 2020-05-27 | Discharge: 2020-05-27 | Disposition: A | Discharge status: Home / Self Care | Payer: No Typology Code available for payment source | Source: Ambulatory Visit | Attending: Family Medicine | Admitting: Family Medicine

## 2020-05-27 DIAGNOSIS — Z1231 Encounter for screening mammogram for malignant neoplasm of breast: Secondary | ICD-10-CM | POA: Insufficient documentation

## 2020-07-22 ENCOUNTER — Ambulatory Visit
Admission: RE | Admit: 2020-07-22 | Discharge: 2020-07-22 | Disposition: A | Discharge status: Home / Self Care | Payer: No Typology Code available for payment source | Source: Ambulatory Visit | Attending: Family Medicine | Admitting: Family Medicine

## 2020-07-22 ENCOUNTER — Other Ambulatory Visit: Payer: Self-pay | Admitting: Family Medicine

## 2020-07-22 DIAGNOSIS — M542 Cervicalgia: Secondary | ICD-10-CM | POA: Insufficient documentation

## 2020-08-13 ENCOUNTER — Ambulatory Visit
Admission: RE | Admit: 2020-08-13 | Discharge: 2020-08-13 | Disposition: A | Discharge status: Home / Self Care | Payer: No Typology Code available for payment source | Source: Ambulatory Visit | Attending: Physical Medicine & Rehabilitation | Admitting: Physical Medicine & Rehabilitation

## 2020-08-13 ENCOUNTER — Other Ambulatory Visit: Payer: Self-pay | Admitting: Physical Medicine & Rehabilitation

## 2020-08-13 DIAGNOSIS — G894 Chronic pain syndrome: Secondary | ICD-10-CM | POA: Insufficient documentation

## 2021-01-09 ENCOUNTER — Encounter (INDEPENDENT_AMBULATORY_CARE_PROVIDER_SITE_OTHER): Payer: Self-pay

## 2021-03-11 ENCOUNTER — Other Ambulatory Visit: Payer: Self-pay | Admitting: Family Medicine

## 2021-03-11 DIAGNOSIS — M7989 Other specified soft tissue disorders: Secondary | ICD-10-CM

## 2021-03-25 ENCOUNTER — Ambulatory Visit: Payer: No Typology Code available for payment source

## 2021-04-03 ENCOUNTER — Ambulatory Visit
Admission: RE | Admit: 2021-04-03 | Discharge: 2021-04-03 | Disposition: A | Discharge status: Home / Self Care | Payer: No Typology Code available for payment source | Source: Ambulatory Visit | Attending: Family Medicine | Admitting: Family Medicine

## 2021-04-03 DIAGNOSIS — M7989 Other specified soft tissue disorders: Secondary | ICD-10-CM

## 2021-04-22 ENCOUNTER — Ambulatory Visit: Admission: RE | Admit: 2021-04-22 | Payer: No Typology Code available for payment source | Source: Ambulatory Visit

## 2021-04-30 ENCOUNTER — Ambulatory Visit
Admission: RE | Admit: 2021-04-30 | Discharge: 2021-04-30 | Disposition: A | Discharge status: Home / Self Care | Payer: No Typology Code available for payment source | Source: Ambulatory Visit | Attending: Family Medicine | Admitting: Family Medicine

## 2021-04-30 DIAGNOSIS — M7989 Other specified soft tissue disorders: Secondary | ICD-10-CM | POA: Insufficient documentation

## 2021-05-20 ENCOUNTER — Other Ambulatory Visit: Payer: Self-pay | Admitting: Family Medicine

## 2021-05-20 DIAGNOSIS — Z1231 Encounter for screening mammogram for malignant neoplasm of breast: Secondary | ICD-10-CM

## 2021-05-29 ENCOUNTER — Ambulatory Visit: Payer: No Typology Code available for payment source

## 2021-06-04 ENCOUNTER — Ambulatory Visit
Admission: RE | Admit: 2021-06-04 | Discharge: 2021-06-04 | Disposition: A | Discharge status: Home / Self Care | Payer: No Typology Code available for payment source | Source: Ambulatory Visit | Attending: Family Medicine | Admitting: Family Medicine

## 2021-06-04 ENCOUNTER — Other Ambulatory Visit: Payer: Self-pay | Admitting: Family Medicine

## 2021-06-04 DIAGNOSIS — Z1231 Encounter for screening mammogram for malignant neoplasm of breast: Secondary | ICD-10-CM | POA: Insufficient documentation

## 2021-06-24 ENCOUNTER — Ambulatory Visit (INDEPENDENT_AMBULATORY_CARE_PROVIDER_SITE_OTHER): Payer: No Typology Code available for payment source | Admitting: Gastroenterology

## 2021-07-01 ENCOUNTER — Encounter (INDEPENDENT_AMBULATORY_CARE_PROVIDER_SITE_OTHER): Payer: Self-pay | Admitting: Gastroenterology

## 2021-07-01 ENCOUNTER — Ambulatory Visit (INDEPENDENT_AMBULATORY_CARE_PROVIDER_SITE_OTHER): Payer: No Typology Code available for payment source | Admitting: Gastroenterology

## 2021-07-01 VITALS — BP 134/84 | HR 93 | Resp 16 | Wt 269.0 lb

## 2021-07-01 DIAGNOSIS — Z1211 Encounter for screening for malignant neoplasm of colon: Secondary | ICD-10-CM

## 2021-07-01 MED ORDER — PEG 3350-KCL-NABCB-NACL-NASULF 236 G PO SOLR
1.0000 | Freq: Once | ORAL | 0 refills | Status: AC
Start: 2021-07-01 — End: 2021-07-01

## 2021-07-01 NOTE — Patient Instructions (Signed)
Please proceed with colonoscopy on Thursday July 16, 2021 at [facility].      Split Dose Colonoscopy Preparation Instructions - COLYTE, GAVILYTE, GOLYTELY, OR NULYTELY  Please carefully read a week before your procedure.    IF YOU ARE ON BLOOD THINNERS (COUMADIN, PLAVIX, etc.), INSULIN OR OTHER DIABETIC MEDICATIONS, PLEASE LET us KNOW AND CHECK WITH YOUR PRESCRIBING PHYSICIAN FOR INSTRUCTIONS. Your prescribing provider needs to determine if you should stop or stay on your blood thinner before procedure.  Not following these instructions may result in cancellation.      General Endoscopy Information  Do no chew gum or suck on hard candy the day of your procedure.  You must have a responsible adult to accompany you home after the procedure. This person must pick you up in the endoscopy unit. If you do not have a responsible adult to accompany you home, your procedure will be cancelled.   You may not operate a motor vehicle for the remainder of the day following your procedure.   You may not take a taxi, Benedetto Goad or bus home unless accompanied by a responsible adult.   If your insurance company requires a REFERRAL, YOU MUST BRING IT WITH YOU. Also, please bring your current insurance card(s), copay (if applicable), and a current picture ID with you on the day of your procedure.   Our office will contact your insurance carrier to verify coverage and, if required, obtain preauthorization for your procedure. However, preauthorization is not a guarantee of payment and you will be responsible for any deductibles, copays, co-insurance, and/or any other plan specific out-of-pocket expenses.   Dependent upon your family history, personal history, prior gastroenterology diagnoses, or findings discovered during your colonoscopy, your procedure may be considered preventative or diagnostic. This determination will not be made until after the procedure has concluded and will be based upon the findings of your exam. In  our experience, many insurance carriers cover preventative and diagnostic colonoscopies differently, and as a result, your out-of-pocket payment may also differ. If you have any questions about your coverage, please contact your insurance carrier directly.   If you have any questions, please contact your GI physician's office during normal business hours.    Preparation Instructions    One (1) day before the procedure date: Split Dose: 1st half    Drink only clear liquids the entire day. No solid food should be taken. Clear liquids include: water, broth without any solid pieces, apple juice, white grape juice, pulp free lemonade, sprite, ginger-ale, coffee or tea without milk or nondairy creamers, plain Jell-O (no added fruit or toppings, no red, purple, or blue Jell-O). See clear diet below.    Prepare the solution: Gavilyte, Golytely, Nulytely or Colyte Prep: mix the powder with water in the provided plastic container to the 'fill" line and chill in the refrigerator.   You may add "Crystal Light" powdered lemonade (as an alternative to the flavor packets) to the solution to improve its taste.  No solid food should be taken during or after the prep.   At 5 p.m.: Begin 1st dose of the prep solution at a rate of 8 ounces every 15-30 minutes (over 1-2 hours) until half of the prep solution is finished. If you feel full or nauseated by drinking the solution, then slow down and finish the first half of the solution before midnight.  Please continue to drink clear liquids until you go to bed.     * It is not uncommon  for individuals to experience bloating or nausea when drinking the solution. If vomiting or other symptoms concern you, please call your GI physician's office.    The day of your procedure: Split dose: 2nd half    6 hours before your procedure time: Start 2nd dose of prep solution. Depending on your procedure start time this may require waking up early to complete the prep.  Drink the solution at a rate of 8  ounces every 15-30 minutes (over 1-2 hours) until you finish the entire solution.  It is important that you finish the remaining 2 liters of the prep solution at least 4 hours before your scheduled procedure.  Complete 2nd dose 4 hours prior to your scheduled start time. Do not take anything by mouth starting 4 hours before your procedure.  Do not eat hard candy or chew gum.   Wear comfortable clothing that is easy to remove and leave jewelry at home. Leave any valuables (i.e., purse, cell phone etc.) with family or companion.  Report to the Endoscopy Procedure Unit 90 minutes before your scheduled procedure time.  Once in the pre-procedural area, you will be asked to put on a hospital gown. A nurse will review your medical history with you (Bring a list of your current medication, allergies, and a copy of your recent EKG). An intravenous line (IV) will be started for your sedation during the procedure.  When your procedure is done, you may remain in the recovery room for up to 1 hour.  Your doctor will discuss the results of your procedure with you and give you a copy of your report.     CLEAR LIQUID DIET   THESE ITEMS ARE ALLOWED:  Water  Clear broth: beef, chicken, vegetable  Juices  Apple juice or cider  White grape juice, white cranberry juice  Tang  Lemonade  Soda (clear color)  Tea  Coffee (without cream)  Clear gelatin (without fruit)  Popsicles (without fruit or cream)  [place] ices THESE ITEMS ARE NOT ALLOWED:  Milk  Cream  Milkshakes  Tomato juice  Orange juice  Cream soups  Any soup other than the listed broth  Oatmeal  Cream of Wheat  Grapefruit juice  Alcohol     PLEASE NOTE: It is extremely important to follow the preparation listed above, so that the doctor will be able to see your entire colon. Your colon must be clear of any stool. Inadequate preparation limits the value of this procedure and could necessitate rescheduling of the examination.     * If you need to reschedule or cancel your  appointment, please call you GI physician's office.      FREQUENTLY ASKED QUESTIONS:    My procedure is in the afternoon. Can I eat in the morning?   A: No. To ensure your safety during the procedure, it is important that the stomach is empty. Any food or liquid in the stomach at the time of the procedure places you at risk of aspirating these contents into the lung leading to a serious complication called aspiration pneumonia. You can drink water until 4 hours before your procedure time.     I ate breakfast (lunch or dinner) the day before my colonoscopy. Is that okay?    A: If the preparation instructions were not followed properly, residual stool may remain in the colon and hide important findings from the examining physician. In some cases, if the colon preparation is not good, you may have to repeat the preparation and  the exam. If you accidentally eat any solid food the day before your exam, please call and ask to speak with a member of the nursing staff. You may be asked to reschedule your procedure.     I don't have a ride. Is that okay?   A: No. If you do not have a responsible adult to accompany you home, YOUR PROCEDURE WILL BE CANCELLED.    How many days prior to the procedure should I discontinue my Coumadin, Plavix or other blood thinning medications?   A: If you are taking any blood thinning medications please let your endoscopist know. Generally speaking, you should quit taking your blood thinning medication 2-7 days prior to some procedures depending on the medication; however, you must check with your endoscopist and your prescribing physician to ensure that it is needed and safe for you to do so.    What medications can I take the day before and the day of my procedure?  A: The day prior to your procedure take your medications the way you normally would. However, for those patients taking any type of bowel cleansing preparation, be advised that you may undergo a prolonged period of diarrhea that  may flush oral medications out of your system before they have time to take effect. The morning of your procedure you should take any blood pressure or heart medications you may be on with a small sip of water. You can hold most other medications and take them once your procedure has been completed. If you have questions about specific medication(s), please call a member of the endoscopy unit clinical staff.    I am diabetic. Do I take my insulin?   A: You must direct the question to the physician who placed you on this medication. Please check your blood sugar the morning of your procedure as you normally would. If you have any questions about your diabetes management in conjunction with your fast for your endoscopic procedure, please consult your primary physician.     I am on pain medication? Can I take it prior to my procedure?   A: You may take your prescription pain medications prior to your procedure with a small sip of water. Please inform the pre-procedural clinical staff of any medications you've taken the day of your procedure. If you have any questions, please call a member of the endoscopy unit clinical staff.    I'm having a menstrual period. Should I reschedule my colonoscopy appointment?   A: No. Your menstrual period will not interfere with your physician's ability to complete your procedure.     May I continue taking my iron tablets?   A: No. Iron may cause the formation of dark color stools which can make it difficult for the physician to complete your colonoscopy if your preparation is less than optimal. We recommend you stop taking your oral iron supplements at least one week prior to your procedure.     I have been on Aspirin therapy for my heart. Should I continue to take it?   A: Aspirin may affect blood coagulation. Please check with your endoscopist and prescribing physician.     I am having a colonoscopy tomorrow. I started my colon preparation on time but now I am experiencing diarrhea  and/or a bloating feeling. What should I do?   A: Nausea, vomiting and a sense of fullness or bloating can occur any time after beginning your colon preparation. However, it is important that you drink all the preparation.  For most people, taking an hour break from the preparation will usually help. Then continue taking the preparation as ordered. If the vomiting returns or symptoms get worse, please call your GI physician's office.    If you have any questions or concerns, please don't hesitate to call.     Sincerely,    [facility] Gastroenterology Team

## 2021-07-06 ENCOUNTER — Other Ambulatory Visit: Payer: Self-pay | Admitting: Family Medicine

## 2021-07-06 DIAGNOSIS — E041 Nontoxic single thyroid nodule: Secondary | ICD-10-CM

## 2021-07-10 ENCOUNTER — Ambulatory Visit
Admission: RE | Admit: 2021-07-10 | Discharge: 2021-07-10 | Disposition: A | Discharge status: Home / Self Care | Payer: No Typology Code available for payment source | Source: Ambulatory Visit | Attending: Family Medicine | Admitting: Family Medicine

## 2021-07-10 ENCOUNTER — Other Ambulatory Visit: Payer: Self-pay | Admitting: Family Medicine

## 2021-07-10 ENCOUNTER — Other Ambulatory Visit: Payer: Self-pay | Admitting: Pain Medicine

## 2021-07-10 DIAGNOSIS — E041 Nontoxic single thyroid nodule: Secondary | ICD-10-CM

## 2021-07-10 DIAGNOSIS — Z720 Tobacco use: Secondary | ICD-10-CM

## 2021-07-10 DIAGNOSIS — E042 Nontoxic multinodular goiter: Secondary | ICD-10-CM | POA: Insufficient documentation

## 2021-07-13 ENCOUNTER — Telehealth (INDEPENDENT_AMBULATORY_CARE_PROVIDER_SITE_OTHER): Payer: Self-pay | Admitting: Gastroenterology

## 2021-07-13 NOTE — Telephone Encounter (Signed)
It seems that pt's procedure was not scheduled yet. If so, please call pt to schedule.

## 2021-07-14 ENCOUNTER — Other Ambulatory Visit (INDEPENDENT_AMBULATORY_CARE_PROVIDER_SITE_OTHER): Payer: Self-pay

## 2021-07-14 DIAGNOSIS — Z1211 Encounter for screening for malignant neoplasm of colon: Secondary | ICD-10-CM

## 2021-07-14 NOTE — Telephone Encounter (Signed)
Patient is scheduled for CLN 07/30/2021 at 2:00 PM [facility]. Patient already has prep and prep instructions.

## 2021-07-23 ENCOUNTER — Ambulatory Visit: Payer: No Typology Code available for payment source

## 2021-07-23 NOTE — PSS Phone Screening (Signed)
Pre-Anesthesia Evaluation    Pre-op phone visit requested by:   Reason for pre-op phone visit: Patient anticipating COLONOSCOPY procedure.    History of Present Illness/Summary:        Problem List:  Medical Problems       Hospital Problem List  Date Reviewed: 05/02/2020   None        Non-Hospital Problem List  Date Reviewed: 05/02/2020            ICD-10-CM Priority Class Noted    Multiple thyroid nodules E04.2   08/10/2012    Second degree atrioventricular block, Mobitz (type) I I44.1   01/18/2014    Allergic rhinitis J30.9   01/18/2014    Hyperlipidemia E78.5   01/19/2014    Morbid obesity E66.01   01/19/2014    Bilateral lower extremity edema R60.0   01/19/2014    Cigarette smoker one half pack a day or less F17.210   04/11/2017    Screening for colon cancer Z12.11   07/14/2021    Overview Signed 07/14/2021 10:19 AM by Denice Paradise, MA     Added automatically from request for surgery [phone]             Medical History   Diagnosis Date    Arthritis     Claustrophobia     Depression     Difficulty walking     walks with cane    Dizziness     when laying flat    Multiple thyroid nodules     noted 2013 admission    Neuromyopathy     lt toes numb    Seasonal allergic rhinitis     Shortness of breath     Wears glasses     for reading     Past Surgical History:   Procedure Laterality Date    AX PAIN CLINIC REQUEST Left 11/22/2016    Procedure: AX PAIN CLINIC REQUEST;  Surgeon: Caro Laroche, MD;  Location: [facility];  Service: Anesthesiology;  Laterality: Left;  NEW PT - WILL FAX ORDER BEFORE ARRIVAL..VP    BIOPSY THYROID      benign nodules       Current Outpatient Medications:     atorvastatin (LIPITOR) 20 MG tablet, Take 20 mg by mouth daily, Disp: , Rfl:     cetirizine (ZYRTEC) 10 MG tablet, Take 1 tablet (10 mg total) by mouth daily., Disp: 90 tablet, Rfl: 0    clotrimazole (LOTRIMIN) 1 % cream, Apply topically as needed, Disp: , Rfl:     diclofenac sodium (VOLTAREN) 1 % Gel topical gel, Apply topically 4 (four) times daily,  Disp: , Rfl:     ESCITALOPRAM OXALATE PO, Take 10 mg by mouth daily  , Disp: , Rfl:     furosemide (LASIX) 40 MG tablet, Take 1 tablet (40 mg total) by mouth daily., Disp: 30 tablet, Rfl: 3    lidocaine (LIDODERM) 5 %, Place 1 patch onto the skin as needed, Disp: , Rfl:     meclizine (ANTIVERT) 25 MG tablet, Take 25 mg by mouth every 8 (eight) hours as needed, Disp: , Rfl:     oxyCODONE-acetaminophen (PERCOCET) 10-325 MG per tablet, Take 1 tablet by mouth every 6 (six) hours as needed, Disp: , Rfl:     tiZANidine (ZANAFLEX) 2 MG tablet, Take 2 mg by mouth every 8 (eight) hours as needed, Disp: , Rfl:     traZODone (DESYREL) 50 MG tablet, as needed  , Disp: , Rfl:  triamcinolone (KENALOG) 0.1 % cream, Apply topically as needed, Disp: , Rfl:      Medication List            Accurate as of July 23, 2021 10:01 AM. Always use your most recent med list.                atorvastatin 20 MG tablet  Take 20 mg by mouth daily  Commonly known as: LIPITOR  Medication Adjustments for Surgery: Take morning of surgery     cetirizine 10 MG tablet  Take 1 tablet (10 mg total) by mouth daily.  Commonly known as: ZyrTEC  Medication Adjustments for Surgery: Take morning of surgery     clotrimazole 1 % cream  Apply topically as needed  Commonly known as: LOTRIMIN  Medication Adjustments for Surgery: Hold day of surgery     diclofenac sodium 1 % Gel topical gel  Apply topically 4 (four) times daily  Commonly known as: VOLTAREN  Medication Adjustments for Surgery: Stop 7 days before surgery     ESCITALOPRAM OXALATE PO  Take 10 mg by mouth daily   Medication Adjustments for Surgery: Take morning of surgery     furosemide 40 MG tablet  Take 1 tablet (40 mg total) by mouth daily.  Commonly known as: LASIX  Medication Adjustments for Surgery: Hold day of surgery     lidocaine 5 %  Place 1 patch onto the skin as needed  Commonly known as: LIDODERM  Medication Adjustments for Surgery: Hold day of surgery     meclizine 25 MG tablet  Take 25 mg  by mouth every 8 (eight) hours as needed  Commonly known as: ANTIVERT  Medication Adjustments for Surgery: Take as needed     oxyCODONE-acetaminophen 10-325 MG per tablet  Take 1 tablet by mouth every 6 (six) hours as needed  Commonly known as: PERCOCET  Medication Adjustments for Surgery: Take as prescribed     tiZANidine 2 MG tablet  Take 2 mg by mouth every 8 (eight) hours as needed  Commonly known as: ZANAFLEX  Medication Adjustments for Surgery: Take as needed     traZODone 50 MG tablet  as needed   Commonly known as: DESYREL  Medication Adjustments for Surgery: Take as prescribed     triamcinolone 0.1 % cream  Apply topically as needed  Commonly known as: KENALOG  Medication Adjustments for Surgery: Hold day of surgery            Allergies   Allergen Reactions    Tramadol Rash    Pollen Extract      Family History   Problem Relation Age of Onset    Heart disease Mother     Heart disease Brother     Heart disease Sister     Kidney disease Father     Heart disease Father     Kidney disease Son         deceased     Heart disease Son     Malignant hyperthermia Neg Hx     Pseudochol deficiency Neg Hx     Breast cancer Neg Hx      Social History     Occupational History    Not on file   Tobacco Use    Smoking status: Every Day     Packs/day: 0.50     Years: 40.00     Pack years: 20.00     Types: Cigarettes    Smokeless tobacco: Never  Vaping Use    Vaping Use: Never used   Substance and Sexual Activity    Alcohol use: No    Drug use: Never    Sexual activity: Not on file       Menstrual History:   LMP / Status  Postmenopausal     No LMP recorded. Patient is postmenopausal.    Tubal Ligation?  No valid surgical or medical questions entered.             Exam Scores:   SDB score Risk Category: No Risk    PONV score Nausea Risk: MODERATE RISK    MST score MST Score: 0    Allergy score      Frailty score         Visit Vitals  Ht 1.575 m (5\' 2" )   Wt 107 kg (236 lb)   BMI 43.16 kg/m

## 2021-07-23 NOTE — Pre-Procedure Instructions (Signed)
Important Instructions Before Your Procedure        Your case is currently scheduled for 07/30/2021 at 1400 with Leilani Merl, MD.  Arrival time 1300.    The date and/or time of your surgery may change.  Your surgeon's office will notify you, up until the business day before surgery, if there is any change to your surgery date or time.  Please don't hesitate to call your surgeon's office directly with any questions.        IMPORTANT You must visit the Preparing for Your Procedure guide link below for additional instructions including fasting guidelines and directions before your procedure. If you received fasting or skin preparation instructions from your surgeon or pre-procedural provider, please follow those specific instructions. The instructions here are general instructions that do not pertain to all patients.  [email or web address]    If the link doesn't open, please copy and paste in your browser    QUESTIONS?  If you have any questions about your pre-procedural evaluation, please call the clinic location where your appointment was performed:     [facility]: [phone]  [facility]: [phone]  [facility]: [phone]  [place]: [phone]  [place]: [phone]

## 2021-07-30 ENCOUNTER — Encounter: Payer: Self-pay | Admitting: Gastroenterology

## 2021-07-30 ENCOUNTER — Encounter
Admission: RE | Disposition: A | Discharge status: Home / Self Care | Payer: Self-pay | Source: Ambulatory Visit | Attending: Gastroenterology

## 2021-07-30 ENCOUNTER — Ambulatory Visit: Payer: No Typology Code available for payment source | Admitting: Certified Registered"

## 2021-07-30 ENCOUNTER — Ambulatory Visit
Admission: RE | Admit: 2021-07-30 | Discharge: 2021-07-30 | Disposition: A | Discharge status: Home / Self Care | Payer: No Typology Code available for payment source | Source: Ambulatory Visit | Attending: Gastroenterology | Admitting: Gastroenterology

## 2021-07-30 ENCOUNTER — Ambulatory Visit: Payer: Self-pay

## 2021-07-30 DIAGNOSIS — F17210 Nicotine dependence, cigarettes, uncomplicated: Secondary | ICD-10-CM | POA: Insufficient documentation

## 2021-07-30 DIAGNOSIS — K635 Polyp of colon: Secondary | ICD-10-CM

## 2021-07-30 DIAGNOSIS — K621 Rectal polyp: Secondary | ICD-10-CM | POA: Insufficient documentation

## 2021-07-30 DIAGNOSIS — K648 Other hemorrhoids: Secondary | ICD-10-CM | POA: Insufficient documentation

## 2021-07-30 DIAGNOSIS — Z79899 Other long term (current) drug therapy: Secondary | ICD-10-CM | POA: Insufficient documentation

## 2021-07-30 DIAGNOSIS — K5730 Diverticulosis of large intestine without perforation or abscess without bleeding: Secondary | ICD-10-CM | POA: Insufficient documentation

## 2021-07-30 DIAGNOSIS — D123 Benign neoplasm of transverse colon: Secondary | ICD-10-CM | POA: Insufficient documentation

## 2021-07-30 DIAGNOSIS — Z1211 Encounter for screening for malignant neoplasm of colon: Secondary | ICD-10-CM | POA: Insufficient documentation

## 2021-07-30 HISTORY — PX: COLONOSCOPY, REMOVAL OF TUMOR, POLYP, OR OTHER LESION BY SNARE TECHNIQUE: SHX3473

## 2021-07-30 SURGERY — COLONOSCOPY, REMOVAL OF TUMOR, POLYP, OR OTHER LESION BY SNARE TECHNIQUE
Anesthesia: Anesthesia General | Site: Anus | Wound class: Clean Contaminated

## 2021-07-30 MED ORDER — EPHEDRINE SULFATE 50 MG/ML IJ/IV SOLN (WRAP)
Status: DC | PRN
Start: 2021-07-30 — End: 2021-07-30
  Administered 2021-07-30: 10 mg via INTRAVENOUS

## 2021-07-30 MED ORDER — LIDOCAINE HCL (PF) 2 % IJ SOLN
INTRAMUSCULAR | Status: AC
Start: 2021-07-30 — End: ?
  Filled 2021-07-30: qty 5

## 2021-07-30 MED ORDER — PROPOFOL 10 MG/ML IV EMUL (WRAP)
INTRAVENOUS | Status: AC
Start: 2021-07-30 — End: ?
  Filled 2021-07-30: qty 40

## 2021-07-30 MED ORDER — PROPOFOL 10 MG/ML IV EMUL (WRAP)
INTRAVENOUS | Status: DC | PRN
Start: 2021-07-30 — End: 2021-07-30
  Administered 2021-07-30 (×2): 50 mg via INTRAVENOUS
  Administered 2021-07-30: 20 mg via INTRAVENOUS
  Administered 2021-07-30 (×2): 50 mg via INTRAVENOUS
  Administered 2021-07-30: 100 mg via INTRAVENOUS
  Administered 2021-07-30 (×2): 30 mg via INTRAVENOUS

## 2021-07-30 MED ORDER — LACTATED RINGERS IV SOLN
INTRAVENOUS | Status: DC
Start: 2021-07-30 — End: 2021-07-30

## 2021-07-30 MED ORDER — LIDOCAINE HCL (PF) 2 % IJ SOLN
INTRAMUSCULAR | Status: DC | PRN
Start: 2021-07-30 — End: 2021-07-30
  Administered 2021-07-30: 50 mg via INTRAVENOUS

## 2021-07-30 MED ORDER — AMMONIA AROMATIC IN INHA
1.0000 | Freq: Once | RESPIRATORY_TRACT | Status: DC | PRN
Start: 2021-07-30 — End: 2021-07-30

## 2021-07-30 SURGICAL SUPPLY — 67 items
CATHETER ELHMST HMGLD GLDPRB 7FR 300CM (Procedure Accessories)
CATHETER OD7 FR L300 CM BIPOLAR ROUND (Procedure Accessories)
CATHETER OD7 FR L300 CM BIPOLAR ROUND DISTAL TIP STANDARD CONNECTOR (Procedure Accessories) IMPLANT
ELECTRODE ELECTROSURGICAL 168 SQ CM L170 (Procedure Accessories)
ELECTRODE ELECTROSURGICAL 168 SQ CM L170 MM NESSY HYDROGEL SPLIT (Procedure Accessories) IMPLANT
ELECTRODE ESURG DDRGL 168SQ CM NESSY 170 (Procedure Accessories)
FORCEPS BIOPSY L240 CM +2.8 MM HOT OD2.2 (Endoscopic Supplies)
FORCEPS BIOPSY L240 CM +2.8 MM HOT OD2.2 MM RADIAL JAW (Endoscopic Supplies) IMPLANT
FORCEPS BIOPSY L240 CM JUMBO MICROMESH (Instrument)
FORCEPS BIOPSY L240 CM JUMBO MICROMESH TEETH STREAMLINE CATHETER (Instrument) IMPLANT
FORCEPS BIOPSY L240 CM LARGE CAPACITY (Instrument) ×1
FORCEPS BIOPSY L240 CM MICROMESH TEETH STREAMLINE CATHETER NEEDLE (Instrument) IMPLANT
FORCEPS BX +2.8MM RJ 4 2.2MM 240CM HOT (Endoscopic Supplies)
FORCEPS BX SS JMB RJ 4 2.8MM 240CM STRL (Instrument)
FORCEPS BX SS LG CPC RJ 4 2.4MM 240CM (Instrument) ×1
GOWN CHEMOTHERAPY L47 IN X W28 IN UNIVERSAL OPEN BACK OVERHEAD KNIT (Gown) ×2 IMPLANT
GOWN CHMO PP PE UNV 47X28IN LF OPN BCK (Gown) ×4
JELLY LUB EZ LF STRL H2O SOL NGRS TRNLU (Irrigation Solutions) ×2 IMPLANT
KIT ENDO W/ FOUR PACK BUTTONS (Kits) ×1
KIT ENDOSCOPIC COMPLIANCE ENDOKIT (Kits) ×1
KIT ENDOSCOPIC COMPLIANCE ENDOKIT ORCAPOD 4 1.1 OZ (Kits) ×1 IMPLANT
KIT IRR AQUASHIELD ENDOGATOR 230CM TUBE (Kits) ×2 IMPLANT
LIGATOR DEVICE ENDOSCOPIC (Disposable Instruments)
LIGATOR ESCP LF STRL DEV DISP (Disposable Instruments)
LIGATOR POLYLOOP DEVICE ENDOSCOPIC (Disposable Instruments) IMPLANT
MARKER ENDOSCOPIC PERMANENT INDICATION (Syringes, Needles)
MARKER ENDOSCOPIC PERMANENT INDICATION DARK SYRINGE SPOT EX 5 ML (Syringes, Needles) IMPLANT
MARKER ESCP 5ML SPOT EX PERM INDCT DRK (Syringes, Needles)
MASK FACE FM FLDSHLD LF LVL 3 TIE EYSHLD (Personal Protection) IMPLANT
NEEDLE SCLEROTHERAPY CARR-LOCKE OD25 GA ODSEC2.5 MM L230 CM INJECTION (Needles) IMPLANT
NEEDLE SCLEROTHERAPY OD25 GA ODSEC2.5 MM (Needles)
NEEDLE SCLRTX SS TFLN CRLK 25GA 2.5MM (Needles)
NET ROTH FOREIGN BODY MAXI GI (Disposable Instruments) IMPLANT
NET ROTH FOREIN BODY STNDRD GI (Disposable Instruments) IMPLANT
NET SPEC RTRVL STD RTHNT 2.5MM 230CM LF (Urology Supply)
NET SPECIMEN RETRIEVAL L230 CM STANDARD (Urology Supply)
NET SPECIMEN RETRIEVAL L230 CM STANDARD SHEATH OD2.5 MM L6 CM X W3 CM (Urology Supply) IMPLANT
PROBE COAG FIAPC 6.9FR 7.2FT CRCMF PLG (Endoscopic Supplies)
PROBE COAGULATION L7.2 FT (Endoscopic Supplies)
PROBE COAGULATION L7.2 FT CIRCUMFERENTIAL PLUG PLAY FUNCTIONALITY (Endoscopic Supplies) IMPLANT
PROBE ELECTROSURGICAL L220 CM FLEXIBLE (Procedure Accessories)
PROBE ELECTROSURGICAL L220 CM FLEXIBLE STRAIGHT FIRE OD2.3 MM FIAPC (Procedure Accessories) IMPLANT
PROBE ESURG FIAPC 2.3MM 220CM STRL FLXB (Procedure Accessories)
SNARE 9 MM L230 CM OD2.4 MM COLD BRAID (Instrument) ×1
SNARE 9 MM L230 CM OD2.4 MM EXACTO COLD BRAID WIRE CLEAN CUT (Instrument) IMPLANT
SNARE ESCP 9MM EXACTO 2.4MM 230CM LF (Instrument) ×1
SNARE ESCP MED OVL SNS 2.4MM 240CM STRL (Procedure Accessories)
SNARE ESCP SPRL SNAREMASTER 2.8MM 20MM (Procedure Accessories)
SNARE MEDIUM OVAL L240 CM OD2.4 MM (Procedure Accessories)
SNARE MEDIUM OVAL L240 CM OD2.4 MM SENSATION LOOP SHORT THROW FLEXIBLE (Procedure Accessories) IMPLANT
SNARE SPIRAL 230CM 2.8MM 20MM SNRMSTR RDG WIRE ENDOSCOPIC POLYPECTOMY (Procedure Accessories) IMPLANT
SNARE SPIRAL L230 CM OD2.8 MM ODSEC20 MM (Procedure Accessories)
SPONGE GAUZE L4 IN X W4 IN 16 PLY (Dressing) ×1
SPONGE GAUZE L4 IN X W4 IN 16 PLY MAXIMUM ABSORBENT USP TYPE VII (Dressing) ×1 IMPLANT
SPONGE GZE CTTN CRTY 4X4IN LF NS 16 PLY (Dressing) ×1
SYRINGE 50 ML GRADUATE NONPYROGENIC DEHP (Syringes, Needles) ×1
SYRINGE 50 ML GRADUATE NONPYROGENIC DEHP FREE PVC FREE BD MEDICAL (Syringes, Needles) ×1 IMPLANT
SYRINGE MED 50ML LF STRL GRAD N-PYRG (Syringes, Needles) ×1
TRAP INLINE SUCTION CHAMBER FLAT SURFACE (Endoscopic Supplies) ×1
TRAP INLINE SUCTION CHAMBER FLAT SURFACE QUICK CATCH SPECIMEN JAR (Endoscopic Supplies) IMPLANT
TRAP MCS PLS LF STRL SCR CAP TUBE ID LBL (Procedure Accessories)
TRAP MUCUS SCREW CAP TUBE ID LABEL (Procedure Accessories)
TRAP MUCUS SCREW CAP TUBE ID LABEL MEDLINE PLASTIC CLEAR (Procedure Accessories) IMPLANT
TRAP SPEC PLS QCK CTCH INLN SCT CHMBR (Endoscopic Supplies) ×1
WATER STERILE PLASTIC POUR BOTTLE 1000 (Irrigation Solutions) ×1
WATER STERILE PLASTIC POUR BOTTLE 1000 ML (Irrigation Solutions) ×1 IMPLANT
WATER STRL 1000ML LF PLS PR BTL (Irrigation Solutions) ×1

## 2021-07-30 NOTE — Discharge Instr - AVS First Page (Addendum)
Endoscopy Discharge Instructions    Repeat colonoscopy in 1 year due to polyps and stool in colon.  Recommend 2 day preparation.  General Instructions:  1. Following sedation, your judgement, perception, and coordination are considered impaired. Even though you may feel awake and alert, you are considered legally intoxicated. Therefore, until the next morning;  Do not Drive  Do not operate appliances or equipment that requires reaction time (e.g.    Stove, electrical tools, machinery)  Do not sign legal documents or be involved in important decisions.  Do not smoke if alone  Do not drink alcoholic beverages  Go directly home and rest for several hours before resuming your routine    activities.  It is highly recommended to have a responsible adult stay with you for the   next 24 hours    2. Tenderness, swelling or pain may occur at the IV site where you received sedation. If you experience this, apply warm soaks to the area. Notify your physician if this persists.    Instructions Specific To Procedures - Report To Physician Any Of The Following:         Colon/Sigmoidoscopy/Proctoscopy   1. Severe and persistent abdominal pain/bloating which does not subside within 2-3 hours   2. Large amount of rectal bleeding (some mucosal blood streaking may occur, especially if biopsy or polypectomy was done or if hemorrhoids are present.   3. Nausea/vomitting   4. Fevers/Chills within 24 hours after procedure. Temp>101deg F     In Addition:   If polyp has been removed, DO NOT take aspirin or aspirin containing products   (e.g. Anacin, Alka Seltzer, Bufferin, Etc.) or non-steroidal anti-inflammatory drugs  (e.g. Advil, Motrin, etc.) for *** days unless otherwise advised by doctor. Tylenol   or extra Strength Tylenol is permitted.          If you have questions or problems contact your MD immediately. If you need immediate attention, call your MD, 911 and/or go to nearest emergency room.

## 2021-07-30 NOTE — Anesthesia Preprocedure Evaluation (Signed)
Anesthesia Evaluation    AIRWAY    Mallampati: II    TM distance: >3 FB  Neck ROM: full  Mouth Opening:full  Planned to use difficult airway equipment: No CARDIOVASCULAR    cardiovascular exam normal       DENTAL    no notable dental hx     PULMONARY    pulmonary exam normal     OTHER FINDINGS    Past Medical History:  No date: Arthritis  No date: Claustrophobia  No date: Depression  No date: Difficulty walking      Comment:  walks with cane  No date: Dizziness      Comment:  when laying flat  No date: Multiple thyroid nodules      Comment:  noted 2013 admission  No date: Neuromyopathy      Comment:  lt toes numb  No date: Seasonal allergic rhinitis  No date: Shortness of breath  No date: Wears glasses      Comment:  for reading              Relevant Problems   CARDIO   (+) Second degree atrioventricular block, Mobitz (type) I               Anesthesia Plan    ASA 3     general                                 informed consent obtained      pertinent labs reviewed             Signed by: Sophronia Simas, DO 07/30/21 1:41 PM

## 2021-07-30 NOTE — H&P (Signed)
60 yo F with rectal bleeding.    Plan:  Colonoscopy.    The colonoscopy procedure was explained to the patient, including indication, alternatives, possible benefits, and potential risks (including but not limited to bleeding, infection, perforation, aspiration, splenic avulsion, anesthesia complications such as slowed breathing and lowered blood pressure). The patient appeared to understand and agrees to proceed.      Past Medical History:     Past Medical History:   Diagnosis Date    Arthritis     Claustrophobia     Depression     Difficulty walking     walks with cane    Dizziness     when laying flat    Multiple thyroid nodules     noted 2013 admission    Neuromyopathy     lt toes numb    Seasonal allergic rhinitis     Shortness of breath     Wears glasses     for reading       Past Surgical History:     Past Surgical History:   Procedure Laterality Date    [facility] PA[facility] REQUEST Left 11/22/2016    Procedure: AX PAIN CLINIC REQUEST;  Surgeon: Caro Laroche, MD;  Location: [facility];  Service: Anesthesiology;  Laterality: Left;  NEW PT - WILL FAX ORDER BEFORE ARRIVAL..VP    BIOPSY THYROID      benign nodules       Family History:     Family History   Problem Relation Age of Onset    Heart disease Mother     Heart disease Brother     Heart disease Sister     Kidney disease Father     Heart disease Father     Kidney disease Son         deceased     Heart disease Son     Malignant hyperthermia Neg Hx     Pseudochol deficiency Neg Hx     Breast cancer Neg Hx        Social History:     Social History     Socioeconomic History    Marital status: Legally Separated     Spouse name: Not on file    Number of children: Not on file    Years of education: Not on file    Highest education level: Not on file   Occupational History    Not on file   Tobacco Use    Smoking status: Every Day     Packs/day: 0.50     Years: 40.00     Pack years: 20.00     Types: Cigarettes    Smokeless tobacco: Never   Vaping Use    Vaping Use: Never  used   Substance and Sexual Activity    Alcohol use: No    Drug use: Never    Sexual activity: Not on file   Other Topics Concern    Not on file   Social History Narrative    Not on file     Social Determinants of Health     Financial Resource Strain: Not on file   Food Insecurity: Not on file   Transportation Needs: Not on file   Physical Activity: Not on file   Stress: Not on file   Social Connections: Not on file   Intimate Partner Violence: Not on file   Housing Stability: Not on file       Allergies:     Allergies  Allergen Reactions    Tramadol Rash    Pollen Extract        Medications:       Review of Systems:   General:  Patient denies lack of appetite, night sweats, weight loss, fatigue, fever.   HEENT:  Patient denies headache, hoarseness   Cardiovascular:  Patient denies swelling of hands/feet, fainting/blacking out, chest pain.   Respiratory:  Patient denies chronic cough, difficulty breathing, wheezing.   Genitourinary:  Patient denies blood in urine, dark urine  Musculoskeletal: Patient denies joint pain, joint stiffness, joint swelling.   Skin:  Patient denies itching, rash.   Neurologic:  Patient denies dizziness, loss of consciousness, fainting, confusion  Heme/Lymphatic:  Patient denies easy bruising.       Pertinent positives noted in HPI.    Physical Exam:   There were no vitals filed for this visit.    General appearance: Well developed, well nourished, appears stated age and in NAD  Eyes: Sclera anicteric, pink conjunctivae, no ptosis  ENMT: mucous membranes moist, nose and ears appear normal.  Oropharynx clear.  Chest: Non labored respirations, no audible wheezing, no clubbing or cyanosis  CV:  Regular rate and rhythm, no JVD, no LE edema  Abdomen: soft, non-tender, non-distended, no masses or organomegaly  Skin: Normal color and turgor, no rashes, no suspicious skin lesions noted  Neuro: CN II-XII grossly intact.  No gross movement disorders noted.  Mental status: Appropriate affect, alert  and oriented x 3

## 2021-07-30 NOTE — Transfer of Care (Signed)
Anesthesia Transfer of Care Note    Patient: Kristina [name]    Procedures performed: Procedure(s):  COLONOSCOPY, POLYPECTOMY    Anesthesia type: MAC    Patient location:Phase I PACU    Last vitals:   Vitals:    07/30/21 1307   BP: 134/63   Pulse: 80   Resp: 16   Temp: 36.8 C (98.2 F)   SpO2: 100%       Post pain: Patient not complaining of pain, continue current therapy      Mental Status:awake    Respiratory Function: tolerating nasal cannula    Cardiovascular: stable    Nausea/Vomiting: patient not complaining of nausea or vomiting    Hydration Status: adequate    Post assessment: no apparent anesthetic complications    Signed by: Magdalen Spatz, CRNA  07/30/21 2:22 PM

## 2021-07-30 NOTE — Anesthesia Postprocedure Evaluation (Signed)
Anesthesia Post Evaluation    Patient: Kristina [name]    Procedure(s):  COLONOSCOPY, POLYPECTOMY    Anesthesia type: general    Last Vitals:   Vitals Value Taken Time   BP 109/62 07/30/21 1445   Temp 36.9 C (98.4 F) 07/30/21 1445   Pulse 95 07/30/21 1445   Resp 20 07/30/21 1445   SpO2 100 % 07/30/21 1445                 Anesthesia Post Evaluation:     Patient Evaluated: PACU  Patient Participation: complete - patient participated  Level of Consciousness: awake and alert  Pain Score: 0  Pain Management: adequate  Multimodal analgesia pain management approach    Airway Patency: patent    Anesthetic complications: No      PONV Status: none    Cardiovascular status: acceptable  Respiratory status: acceptable  Hydration status: acceptable        Signed by: Sophronia Simas, DO, 07/30/2021 4:15 PM

## 2021-07-31 ENCOUNTER — Encounter: Payer: Self-pay | Admitting: Gastroenterology

## 2021-08-06 LAB — LAB USE ONLY - HISTORICAL SURGICAL PATHOLOGY

## 2021-08-15 NOTE — Progress Notes (Signed)
Jule Economy, M.D.  [facility]  [address], [address]  [place], [place].  [address]  Phone: [phone]  Fax:[phone]      Date Time: 07/01/2021 2:53 PM   Patient Name: [name]  Referring Physician:         Reason for Consultation:   Rectal bleeding  Obesity    History:   Kristina [name] is a 60 y.o. female with PMH significant for arthritis and depression presents the office today for an evaluation rectal bleeding.  Patient has a +FIT test.    Patient denies any change in bowel habits. She denies any blood in her stool, constipation, and diarrhea. She has had no weight loss and has no family history of colon cancer. Patient has never had a colonoscopy.    Patient denies any dysphagia, nausea, vomiting, abdominal pain, and reflux.     She is obese, BMI 47.        Past Medical History:     Past Medical History:   Diagnosis Date    Arthritis     Depression     Difficulty walking     walks with cane    Low back pain     herniated disc    Multiple thyroid nodules     noted 2013 admission    Neuromyopathy     toes numb    Seasonal allergic rhinitis     Shortness of breath        Past Surgical History:     Past Surgical History:   Procedure Laterality Date    AX PAIN CLINIC REQUEST Left 11/22/2016    Procedure: AX PAIN CLINIC REQUEST;  Surgeon: Caro Laroche, MD;  Location: [facility];  Service: Anesthesiology;  Laterality: Left;  NEW PT - WILL FAX ORDER BEFORE ARRIVAL..VP       Family History:     Family History   Problem Relation Age of Onset    Heart disease Mother     Heart disease Brother     Heart disease Sister     Kidney disease Father     Heart disease Father     Kidney disease Son         deceased     Heart disease Son     Malignant hyperthermia Neg Hx     Pseudochol deficiency Neg Hx     Breast cancer Neg Hx        Social History:     Social History     Socioeconomic History    Marital status: Legally Separated   Tobacco Use    Smoking status: Every Day     Packs/day: 4.00     Years: 40.00      Pack years: 160.00     Types: Cigarettes    Smokeless tobacco: Never   Vaping Use    Vaping Use: Never used   Substance and Sexual Activity    Alcohol use: No    Drug use: No       Allergies:     Allergies   Allergen Reactions    Tramadol Rash    Pollen Extract Hives       Medications:     Current Outpatient Medications:     atorvastatin (LIPITOR) 20 MG tablet, Take 20 mg by mouth daily, Disp: , Rfl:     cetirizine (ZYRTEC) 10 MG tablet, Take 1 tablet (10 mg total) by mouth daily., Disp: 90 tablet, Rfl: 0    clotrimazole (LOTRIMIN) 1 %  cream, APPLY EXTERNALLY TWICE A DAY FOR 14 DAYS, Disp: , Rfl:     ESCITALOPRAM OXALATE PO, Take 10 mg by mouth daily  , Disp: , Rfl:     furosemide (LASIX) 40 MG tablet, Take 1 tablet (40 mg total) by mouth daily., Disp: 30 tablet, Rfl: 3    lidocaine (LIDODERM) 5 %, APPLY 1 PATCH TO INTACT SKIN OF NECK ONCE A DAY FOR 15 DAYS. REMOVE AFTER 12 HOURS., Disp: , Rfl:     meclizine (ANTIVERT) 25 MG tablet, Take 25 mg by mouth every 8 (eight) hours as needed, Disp: , Rfl:     oxyCODONE-acetaminophen (PERCOCET) 10-325 MG per tablet, TAKE 1/2 TABLET EVERY 6 HOURS AS NEEDED FOR SEVERE PAIN, Disp: , Rfl:     tiZANidine (ZANAFLEX) 2 MG tablet, , Disp: , Rfl:     traZODone (DESYREL) 50 MG tablet, as needed  , Disp: , Rfl:     triamcinolone (KENALOG) 0.1 % cream, , Disp: , Rfl:     diclofenac sodium (VOLTAREN) 1 % Gel topical gel, Apply topically 4 (four) times daily. (Patient not taking: Reported on 07/01/2021), Disp: , Rfl:     ibuprofen (ADVIL,MOTRIN) 600 MG tablet, Take 600 mg by mouth every 6 (six) hours as needed for Pain. (Patient not taking: Reported on 07/01/2021), Disp: , Rfl:       Review of Systems:   Constitutional:No fever,chills,weight loss or gain.  Integument:No skin rashes or lesions.  Eyes:No changes in vision  ENMT:No changes in hearing,nasal discharge,sore throat,oral lesions.  Respiratory:No shortness of breath or cough.  Cardiovascular:No chest pain or  palpitations.  Genitourinary:No nocturia,urinary frequency, or dysuria.  Gastrointestinal:See HPI.  Musculoskeletal:No back or joint pain.  Neurologic:No migraine headaches,numbness, or tingling.  Endocrine:No polydipsia,cold or heat intolerance.  Hematologic:No bleeding or bruising.  Psychiatric:No depression or anxiety.     Physical Exam:     Vitals:    07/01/21 1432   BP: 134/84   BP Site: Left arm   Patient Position: Sitting   Cuff Size: Large   Pulse: 93   Resp: 16   SpO2: 97%   Weight: 122 kg (269 lb)           General appearance - Well developed and well nourished. Normal gait.  HEENT- Normocephalic. Eomi. Sclera anicteric. Normal hearing. Nares normal.  Oropharynx normal.  Neck - Supple.  No thyromegaly.  No cervical lymphadenopathy.  Chest - clear to percussion and auscultation.  No wheeze, rhonchi, or rales.  Heart - regular rate and rhythm without murmurs, gallops, or rubs.  Abdomen - normal bowel sounds.  No focal tenderness to palpation. No hepatosplenomegaly.  No masses. No ascites.  Rectal - deferred until time of colonoscopy.  Musculoskeletal - normal range of motion of arms and legs.  Extremities - no clubbing, cyanosis, or edema.  Skin - no rashes or lesions.  Neurologic - Alert and oriented to person, place and time.  No focal motor or sensory deficits.  No asterixis.  Recent and remote memory normal.       Assessment/Plan   Advised patient that due to her age a colonoscopy is recommended due to possible rectal bleeding lesion.    Rectal bleeding was discussed in detail with the patient. Potential etiologies include anorectal pathology (such as hemorrhoids, anal fissure, etc), diverticular bleed, large/advanced polyps, colorectal mass, vascular lesion, colitis/proctitis. Further evaluation with colonoscopy is recommended, and additional recommendations may be made based on the results.       The  colonoscopy procedure was explained to the patient, including indication, alternatives, possible  benefits, and potential risks (including but not limited to bleeding, infection, perforation, aspiration, splenic avulsion, anesthesia complications such as slowed breathing and lowered blood pressure). The patient appeared to understand and agrees to proceed.     She was asked to loose weight and exercise.    All of the above was reviewed and discussed with the patient.  Face-to-face time was spent in patient education counseling and coordination of care. Additionally, differential diagnoses, suggested diagnostic work-up and potential treatment options were discussed. Questions and concerns were addressed. Will proceed as noted and follow clinically.The patient verbalized understanding of the assessment and plan with verbalized understanding and all questions answered. Patient education provided. Potential medication side effects discussed with patient including risk and benefits. Further recommendations based on clinical progression and test results. Contact information provided and instructed to inform us with changes.     65 minutes was spent on this encounter.  This includes the time spent reviewing pathology and imaging reports prior to the encounter as well as time spent discussing the care with other physicians and also face to face time with the patient.   Greater than 50% of this time was spent counseling and coordinating care.

## 2021-08-18 ENCOUNTER — Encounter (INDEPENDENT_AMBULATORY_CARE_PROVIDER_SITE_OTHER): Payer: Self-pay | Admitting: Cardiovascular Disease

## 2021-08-18 ENCOUNTER — Ambulatory Visit (INDEPENDENT_AMBULATORY_CARE_PROVIDER_SITE_OTHER): Payer: No Typology Code available for payment source | Admitting: Cardiovascular Disease

## 2021-08-18 VITALS — BP 126/68 | HR 77 | Ht 62.0 in | Wt 258.0 lb

## 2021-08-18 DIAGNOSIS — I441 Atrioventricular block, second degree: Secondary | ICD-10-CM

## 2021-08-18 LAB — ECG 12-LEAD
Atrial Rate: 76 {beats}/min
IHS MUSE NARRATIVE AND IMPRESSION: NORMAL
P Axis: 65 degrees
P-R Interval: 148 ms
Q-T Interval: 368 ms
QRS Duration: 76 ms
QTC Calculation (Bezet): 414 ms
R Axis: -33 degrees
T Axis: -6 degrees
Ventricular Rate: 76 {beats}/min

## 2021-08-18 NOTE — Progress Notes (Signed)
[facility] VISIT      Chief Complaint   Patient presents with    Second degree atrioventricular block, Mobitz (type) I     No sxs        I had the pleasure of seeing Kristina [name] today for cardiovascular follow up. She is a pleasant 60 y.o. female with a history of obesity, tobacco use, HL, who presents for continued management.      She was sent by her PCP for an evaluation. Apparently she was found to have mobitz I AV block - unclear if this was on an EKG or telemetry. She has vertigo and takes meclizine. No syncope. She has chronic LE edema and takes lasix 40mg  daily chronically. No CP with daily activities. She denies unusual SOB with exertion - ambulates with a cane. She has chronic numbness on her L side after a L knee cortisone injection.   She continues to smoke about 5 cigs/day.     Echo 05/02/17 showed EF of 55-60%, no sig valvular disease, ASA without shunt      MEDICATIONS:     Current Outpatient Medications   Medication Sig Dispense Refill    atorvastatin (LIPITOR) 20 MG tablet Take 20 mg by mouth daily      cetirizine (ZYRTEC) 10 MG tablet Take 1 tablet (10 mg total) by mouth daily. 90 tablet 0    clotrimazole (LOTRIMIN) 1 % cream Apply topically as needed      diclofenac sodium (VOLTAREN) 1 % Gel topical gel Apply topically 4 (four) times daily      ESCITALOPRAM OXALATE PO Take 10 mg by mouth daily         furosemide (LASIX) 40 MG tablet Take 1 tablet (40 mg total) by mouth daily. 30 tablet 3    lidocaine (LIDODERM) 5 % Place 1 patch onto the skin as needed      meclizine (ANTIVERT) 25 MG tablet Take 25 mg by mouth every 8 (eight) hours as needed      oxyCODONE-acetaminophen (PERCOCET) 10-325 MG per tablet Take 1 tablet by mouth every 6 (six) hours as needed      tiZANidine (ZANAFLEX) 2 MG tablet Take 2 mg by mouth every 8 (eight) hours as needed      traZODone (DESYREL) 50 MG tablet as needed         triamcinolone (KENALOG) 0.1 % cream Apply topically as needed       No current  facility-administered medications for this visit.       REVIEW OF SYSTEMS: All other systems reviewed and negative except as above.    PHYSICAL EXAMINATION  Vital Signs: BP 126/68 (BP Site: Left arm, Patient Position: Sitting, Cuff Size: Large)   Pulse 77   Ht 1.575 m (5\' 2" )   Wt 117 kg (258 lb)   SpO2 98%   BMI 47.19 kg/m    Vital signs reviewed    Wt Readings from Last 3 Encounters:   08/18/21 117 kg (258 lb)   07/30/21 116.1 kg (256 lb)   07/23/21 107 kg (236 lb)        General Appearance:  A well-appearing female in no acute distress.    HEENT: Sclera anicteric, conjunctiva without pallor, moist mucous membranes, normal dentition.   Neck:  Supple without jugular venous distention.  Normal carotid upstrokes without bruits.   Chest: Clear to auscultation bilaterally with good air movement and respiratory effort and no wheezes, rales, or rhonchi   Cardiac:  RRR.  Normal S1 and physiologically split S2, without gallops or rub. No murmurs.   Vascular:  2+ carotid, radial pulses bilaterally  Abdomen: Soft, nontender, nondistended, with normoactive bowel sounds.  No bruits.   Extremities: mild edema, no clubbing, or cyanosis.   Skin: No rash, warm, appropriate for race.   Neuro: Alert and oriented x3. Grossly intact.  CN II-XII intact.  Normal mood and affect.     ECG:   Independent review shows:  NSR, L axis, low voltage    ASSESSMENT/PLAN:    Mobitz I AV block. No AV block on today's EKG. Will get 1 week event monitor. If no significant findings, no further workup needed at this time.   HL. On statin.     All patient's questions and concerns regarding cardiovascular disease were answered during this visit.    Orders Placed This Encounter   Procedures    ([facility]) Mobile Cardiac Telemetry    ECG 12 lead       Return if symptoms worsen or fail to improve.    Governor Specking, MD  08/18/2021

## 2021-08-19 ENCOUNTER — Telehealth (INDEPENDENT_AMBULATORY_CARE_PROVIDER_SITE_OTHER): Payer: Self-pay

## 2021-08-19 ENCOUNTER — Encounter (INDEPENDENT_AMBULATORY_CARE_PROVIDER_SITE_OTHER): Payer: Self-pay | Admitting: Cardiovascular Disease

## 2021-08-19 ENCOUNTER — Encounter (INDEPENDENT_AMBULATORY_CARE_PROVIDER_SITE_OTHER): Payer: Self-pay

## 2021-08-19 NOTE — Telephone Encounter (Signed)
NSR with 4.4 sec pause d/t high grade AV block. Spoke with pt who sais she was asleep at the time. No reported symptoms. Report scanned in chart. Relayed to Dr. Willaim Bane via secure chat.

## 2021-08-20 ENCOUNTER — Encounter (INDEPENDENT_AMBULATORY_CARE_PROVIDER_SITE_OTHER): Payer: Self-pay

## 2021-08-29 NOTE — Progress Notes (Signed)
Ms. Beverley,     The biopsy from your recent colonoscopy revealed one or more adenomatous polyp. Adenomatous polyps are precancerous growths that arise from the lining of the large intestines (colon). Although polyps may not pose an initial health risk, there is the potential for adenomatous polyps to become cancerous if not removed. On your current specimen there was no evidence of malignancy (no cancer). Repeat examinations are recommended to check if you have made any new pre-cancerous polyps that need removal.     For this reason, and due to poor preparation,  I recommend a repeat colonoscopy in 1 year.    I hope that this information is helpful to you. Your health and the quality of care you receive are important to me. Please call the office if you have any questions or concerns.

## 2021-08-29 NOTE — Progress Notes (Signed)
Please set colonoscopy recall for 1 year

## 2021-09-02 ENCOUNTER — Ambulatory Visit (INDEPENDENT_AMBULATORY_CARE_PROVIDER_SITE_OTHER): Payer: No Typology Code available for payment source | Admitting: Cardiovascular Disease

## 2021-09-02 DIAGNOSIS — I441 Atrioventricular block, second degree: Secondary | ICD-10-CM

## 2021-09-03 ENCOUNTER — Encounter (INDEPENDENT_AMBULATORY_CARE_PROVIDER_SITE_OTHER): Payer: Self-pay

## 2021-09-03 ENCOUNTER — Telehealth (INDEPENDENT_AMBULATORY_CARE_PROVIDER_SITE_OTHER): Payer: Self-pay | Admitting: Cardiovascular Disease

## 2021-09-03 NOTE — Progress Notes (Signed)
[facility] - [facility] Cardiac Telemetry      Kristina [name]         [id number], female, [birth year]    Ordering Physician:  Carolynne Edouard, MD  Primary Physician:  Naaman Plummer, MD  Primary Cardiologist:  Carolynne Edouard, MD    Indication:  Second degree atrioventricular block, Mobitz (type) I    Data     Hook up date:  08/18/2021        Disconnect date:  08/25/2021  Total days of monitoring:  7  Resulted date:  09/03/21  Quality:  good   Tech comments:      Findings     Baseline rhythm:  Sinus rhythm  Arrythmia:  Mobitz I AV block with evidence of higher grade AV block with a pause of 4.7 seconds during sleeping hour    Symptoms reported:  none  Symptom correlation with arrhythmia:  NA    Impression     Mobitz I AV block with evidence of higher grade AV block with a pause of 4.7 seconds during sleeping hour      Interpreted and electronically signed by:  Carolynne Edouard, MD, [facility]

## 2021-09-03 NOTE — Telephone Encounter (Signed)
L/vm letting pt know to sch follow

## 2021-09-23 NOTE — Progress Notes (Unsigned)
[facility] CONSULTATION      No chief complaint on file.      I had the pleasure of seeing Kristina [name] today for cardiovascular evaluation. She is a pleasant 60 y.o. female with a history of  Mobitz type 1 block, Obesity, tobacco abuse, HLP    Echo: 05/02/2017  1. Normal left ventricular size and systolic function,   estimated EF 55 to 60%               2. Mild mid anteroseptal hypokinesis                3. Normal diastolic function                4. No significant valvular abnormality                5. Aneurysmal atrial septum with no evidence of shunt.    [place] 03/08/2018   No evidence of abdominal aortic aneurysm with a normal  caliber widely patent aorta noted.     PAST MEDICAL HISTORY:   Past Medical History:   Diagnosis Date    Arthritis     Claustrophobia     Depression     Difficulty walking     walks with cane    Dizziness     when laying flat    Multiple thyroid nodules     noted 2013 admission    Neuromyopathy     lt toes numb    Seasonal allergic rhinitis     Shortness of breath     Wears glasses     for reading         MEDICATIONS:     Current Outpatient Medications   Medication Sig Dispense Refill    atorvastatin (LIPITOR) 20 MG tablet Take 20 mg by mouth daily      cetirizine (ZYRTEC) 10 MG tablet Take 1 tablet (10 mg total) by mouth daily. 90 tablet 0    clotrimazole (LOTRIMIN) 1 % cream Apply topically as needed      diclofenac sodium (VOLTAREN) 1 % Gel topical gel Apply topically 4 (four) times daily      ESCITALOPRAM OXALATE PO Take 10 mg by mouth daily         furosemide (LASIX) 40 MG tablet Take 1 tablet (40 mg total) by mouth daily. 30 tablet 3    lidocaine (LIDODERM) 5 % Place 1 patch onto the skin as needed      meclizine (ANTIVERT) 25 MG tablet Take 25 mg by mouth every 8 (eight) hours as needed      oxyCODONE-acetaminophen (PERCOCET) 10-325 MG per tablet Take 1 tablet by mouth every 6 (six) hours as needed      tiZANidine (ZANAFLEX) 2 MG tablet Take 2 mg by mouth every 8  (eight) hours as needed      traZODone (DESYREL) 50 MG tablet as needed         triamcinolone (KENALOG) 0.1 % cream Apply topically as needed       No current facility-administered medications for this visit.           ALLERGIES:   Allergies   Allergen Reactions    Tramadol Rash    Pollen Extract          FAMILY HISTORY: Her family history includes Heart disease in her brother, father, mother, sister, and son; Kidney disease in her father and son.  No premature CAD.  SOCIAL HISTORY: She reports that she has been smoking cigarettes. She has a 20.00 pack-year smoking history. She has never used smokeless tobacco. She reports that she does not drink alcohol and does not use drugs.      REVIEW OF SYSTEMS:   All other systems reviewed and negative except as above.         PHYSICAL EXAMINATION  Vital Signs: There were no vitals taken for this visit.   Vital signs reviewed    Wt Readings from Last 3 Encounters:   08/18/21 117 kg (258 lb)   07/30/21 116.1 kg (256 lb)   07/23/21 107 kg (236 lb)        General Appearance:  A well-appearing female in no acute distress.    HEENT: Sclera anicteric, conjunctiva without pallor, moist mucous membranes, normal dentition.   Neck:  Supple. No jugular venous distention.  No carotid bruits.   Chest: Clear to auscultation bilaterally with good air movement and respiratory effort. No wheezes, rales.    Cardiac: Regular S1, S2.  Normal S1 and S2, without gallops or rub. No murmurs.    Vascular:  Radial pulses palpable bilaterally  Abdomen: Soft, nontender, nondistended, with normoactive bowel sounds.  No bruits.   Extremities: Warm. No pedal edema. No clubbing, or cyanosis.   Skin: No rash, warm.    Neuro: Alert and oriented x3. Normal strength in four extremities.  Normal mood and affect.       ECG:  Date:   Independent review shows:      ASSESSMENT/PLAN:           All patient's questions and concerns regarding cardiovascular disease were answered during this visit.    Patient  instruction:       No orders of the defined types were placed in this encounter.      No follow-ups on file.    Ellender Hose, MD  09/23/2021

## 2021-09-24 ENCOUNTER — Encounter (INDEPENDENT_AMBULATORY_CARE_PROVIDER_SITE_OTHER): Payer: Self-pay | Admitting: Internal Medicine

## 2021-09-24 ENCOUNTER — Ambulatory Visit (INDEPENDENT_AMBULATORY_CARE_PROVIDER_SITE_OTHER): Payer: No Typology Code available for payment source | Admitting: Internal Medicine

## 2021-09-24 ENCOUNTER — Other Ambulatory Visit (INDEPENDENT_AMBULATORY_CARE_PROVIDER_SITE_OTHER): Payer: Self-pay | Admitting: Cardiovascular Disease

## 2021-09-24 VITALS — BP 134/84 | HR 80 | Wt 258.0 lb

## 2021-09-24 DIAGNOSIS — R0683 Snoring: Secondary | ICD-10-CM

## 2021-09-24 DIAGNOSIS — I441 Atrioventricular block, second degree: Secondary | ICD-10-CM

## 2021-09-24 DIAGNOSIS — I455 Other specified heart block: Secondary | ICD-10-CM

## 2021-09-24 NOTE — Addendum Note (Signed)
Addended byOrma Flaming, MADHAB on: 09/24/2021 01:53 PM     Modules accepted: Orders

## 2021-09-24 NOTE — Patient Instructions (Addendum)
Follow up with electrophysiologist for possible pacemaker evaluation.   I will call you once I get update from electrophysiologist.  Go to emergency department if you have significant dizziness, passing out spells.  Blood test  Do not drive motor vehicle or use machinery.  Gradual change in position while standing.   Sleep medicine evaluation.

## 2021-09-28 ENCOUNTER — Encounter (INDEPENDENT_AMBULATORY_CARE_PROVIDER_SITE_OTHER): Payer: Self-pay

## 2021-10-01 NOTE — OR Nursing (Signed)
LM with instructions regarding check-in for [facility] [id number]/20/22  Left # to call back with additional concerns

## 2021-10-06 ENCOUNTER — Telehealth (INDEPENDENT_AMBULATORY_CARE_PROVIDER_SITE_OTHER): Payer: Self-pay | Admitting: Cardiovascular Disease

## 2021-10-06 NOTE — Telephone Encounter (Signed)
Spoke with patient who opted to cancel loop implant procedure and reschedule to a later date due to authorization still pending. Patient stated she will call me back tomorrow to reschedule. Notified dr. Willaim Bane and dr. Orma Flaming via staff message. Dr. Orma Flaming confirmed procedure is not medically urgent via secure chat    Kristina [name]  Procedure coordinator

## 2021-10-06 NOTE — Telephone Encounter (Addendum)
Spoke with patient to inform patient Berkley Harvey still pending from [facility]. Patient states she will call insurance and call me back. Offered to help patient reschedule. Patient would like to contact insurance first.    [facility]  Procedure coordinator

## 2021-10-06 NOTE — Telephone Encounter (Signed)
Milto called from [facility] in reference to patient's procedure tomorrow for a loop implant @ [facility]. He states the case is still pending might need a peer to peer maybe stating it's life threating.case# [id number]     Phone#[phone]  [facility] @ [facility] [phone]    [facility]  Cardiac connect

## 2021-10-07 ENCOUNTER — Encounter: Admission: RE | Payer: Self-pay | Source: Ambulatory Visit

## 2021-10-07 ENCOUNTER — Ambulatory Visit: Payer: No Typology Code available for payment source

## 2021-10-07 DIAGNOSIS — I441 Atrioventricular block, second degree: Secondary | ICD-10-CM

## 2021-10-07 SURGERY — LOOP RECORDER  IMPLANT
Anesthesia: Local

## 2021-10-13 ENCOUNTER — Ambulatory Visit: Payer: No Typology Code available for payment source

## 2021-10-20 ENCOUNTER — Ambulatory Visit (HOSPITAL_BASED_OUTPATIENT_CLINIC_OR_DEPARTMENT_OTHER)
Admission: RE | Admit: 2021-10-20 | Discharge: 2021-10-20 | Disposition: A | Discharge status: Home / Self Care | Payer: No Typology Code available for payment source | Source: Ambulatory Visit | Attending: Internal Medicine | Admitting: Internal Medicine

## 2021-10-20 ENCOUNTER — Other Ambulatory Visit (INDEPENDENT_AMBULATORY_CARE_PROVIDER_SITE_OTHER): Payer: Self-pay | Admitting: Internal Medicine

## 2021-10-20 ENCOUNTER — Ambulatory Visit
Admission: RE | Admit: 2021-10-20 | Discharge: 2021-10-20 | Disposition: A | Discharge status: Home / Self Care | Payer: No Typology Code available for payment source | Source: Ambulatory Visit | Attending: Internal Medicine | Admitting: Internal Medicine

## 2021-10-20 DIAGNOSIS — I441 Atrioventricular block, second degree: Secondary | ICD-10-CM

## 2021-10-20 DIAGNOSIS — I455 Other specified heart block: Secondary | ICD-10-CM | POA: Insufficient documentation

## 2021-10-20 LAB — BASIC METABOLIC PANEL
Anion Gap: 5 (ref 5.0–15.0)
BUN: 11 mg/dL (ref 7.0–21.0)
CO2: 32 mEq/L — ABNORMAL HIGH (ref 17–29)
Calcium: 10 mg/dL (ref 8.5–10.5)
Chloride: 101 mEq/L (ref 99–111)
Creatinine: 0.8 mg/dL (ref 0.4–1.0)
Glucose: 90 mg/dL (ref 70–100)
Potassium: 4.2 mEq/L (ref 3.5–5.3)
Sodium: 138 mEq/L (ref 135–145)

## 2021-10-20 LAB — MAGNESIUM: Magnesium: 1.7 mg/dL (ref 1.6–2.6)

## 2021-10-20 LAB — HEMOLYSIS INDEX: Hemolysis Index: 2 Index (ref 0–24)

## 2021-10-20 LAB — GFR: EGFR: >60

## 2021-10-20 LAB — TSH: TSH: 1.75 u[IU]/mL (ref 0.35–4.94)

## 2021-10-20 NOTE — Progress Notes (Signed)
Patient reported using cane/walker and unable to exercise on the treadmill to assess chronotropic response. Will cancel treadmill exercise stress test.  Follow up with Dr Willaim Bane. She has loop recorder scheduled with Dr Willaim Bane.

## 2021-10-21 ENCOUNTER — Ambulatory Visit (INDEPENDENT_AMBULATORY_CARE_PROVIDER_SITE_OTHER): Payer: No Typology Code available for payment source

## 2021-10-21 DIAGNOSIS — I441 Atrioventricular block, second degree: Secondary | ICD-10-CM

## 2021-10-22 ENCOUNTER — Encounter (INDEPENDENT_AMBULATORY_CARE_PROVIDER_SITE_OTHER): Payer: Self-pay

## 2021-10-22 LAB — ECHOCARDIOGRAM ADULT COMPLETE W CLR/ DOPP WAVEFORM
AV Area (Cont Eq VTI): 2.369
AV Area (Cont Eq VTI): 2.37
AV Mean Gradient: 2.813
AV Mean Gradient: 2.813
AV Peak Velocity: 115.274
AV Peak Velocity: 115.274
Ao Root Diameter (2D): 2.574
BP Mod LV Ejection Fraction: 55.111
IVS Diastolic Thickness (2D): 0.974
IVS Diastolic Thickness (2D): 0.974
LA Dimension (2D): 2.875
LA Dimension (2D): 2.875
LA Volume Index (BP A-L): 0.018
LVID diastole (2D): 3.47
LVID diastole (2D): 3.47
LVID systole (2D): 2.531
LVID systole (2D): 2.531
MV Area (PHT): 4.38
MV E/A: 0.955
MV E/A: 0.955
MV E/e' (Average): 7.109
Prox Ascending Aorta Diameter: 2.483
Pulmonary Valve Findings: NORMAL
RV Basal Diastolic Dimension: 3.066
RV Function: NORMAL
Site RA Size (AS): NORMAL
Site RV Size (AS): NORMAL
TAPSE: 1.764
TAPSE: 1.764

## 2021-11-16 ENCOUNTER — Encounter (INDEPENDENT_AMBULATORY_CARE_PROVIDER_SITE_OTHER): Payer: Self-pay | Admitting: Cardiovascular Disease

## 2021-11-16 ENCOUNTER — Ambulatory Visit (INDEPENDENT_AMBULATORY_CARE_PROVIDER_SITE_OTHER): Payer: No Typology Code available for payment source | Admitting: Cardiovascular Disease

## 2021-11-16 VITALS — BP 141/84 | HR 80 | Ht 62.0 in | Wt 269.2 lb

## 2021-11-16 DIAGNOSIS — I441 Atrioventricular block, second degree: Secondary | ICD-10-CM

## 2021-11-16 DIAGNOSIS — E7849 Other hyperlipidemia: Secondary | ICD-10-CM

## 2021-11-16 DIAGNOSIS — I455 Other specified heart block: Secondary | ICD-10-CM

## 2021-11-16 NOTE — Progress Notes (Signed)
[facility] OFFICE VISIT      Chief Complaint   Patient presents with    Second degree atrioventricular block, Mobitz (type) I    Results     Monitor and echo       I had the pleasure of seeing Kristina [name] today for cardiovascular follow up. She is a pleasant 61 y.o. female with a history of obesity, tobacco use, HL, AV block, who presents for continued management.      She has a h/o mobitz I AV block. Event monitor done in 08/2021 showed evidence of higher grade AV block with pauses up to 4.7 seconds during sleeping hours. She has intermittent dizziness vs vertigo and takes meclizine. Symptoms seem to be more frequent but not lasting longer. Meclizine continues to help. No ho syncope. She has chronic LE edema and takes lasix 40mg  daily chronically. No CP with daily activities. She denies unusual SOB with exertion - ambulates with a cane. She has chronic numbness on her L side after a L knee cortisone injection.   She continues to smoke about 5 cigs/day. She has daytimes somnolence.     Echo 10/21/21 showed EF of 55%, mild LVH, no sig valvular disease    Echo 05/02/17 showed EF of 55-60%, no sig valvular disease, ASA without shunt      MEDICATIONS:     Current Outpatient Medications   Medication Sig Dispense Refill    atorvastatin (LIPITOR) 20 MG tablet Take 20 mg by mouth daily      cetirizine (ZYRTEC) 10 MG tablet Take 1 tablet (10 mg total) by mouth daily. 90 tablet 0    clotrimazole (LOTRIMIN) 1 % cream Apply topically as needed      diclofenac sodium (VOLTAREN) 1 % Gel topical gel Apply topically 4 (four) times daily      ESCITALOPRAM OXALATE PO Take 10 mg by mouth daily         furosemide (LASIX) 40 MG tablet Take 1 tablet (40 mg total) by mouth daily. 30 tablet 3    lidocaine (LIDODERM) 5 % Place 1 patch onto the skin as needed      meclizine (ANTIVERT) 25 MG tablet Take 25 mg by mouth every 8 (eight) hours as needed      oxyCODONE-acetaminophen (PERCOCET) 10-325 MG per tablet Take 1 tablet by mouth  every 6 (six) hours as needed      traZODone (DESYREL) 50 MG tablet as needed         triamcinolone (KENALOG) 0.1 % cream Apply topically as needed       No current facility-administered medications for this visit.       REVIEW OF SYSTEMS: All other systems reviewed and negative except as above.    PHYSICAL EXAMINATION  Vital Signs: BP 141/84 (BP Site: Left arm, Patient Position: Sitting, Cuff Size: Large)   Pulse 80   Ht 1.575 m (5\' 2" )   Wt 122.1 kg (269 lb 3.2 oz)   SpO2 98%   BMI 49.24 kg/m    Vital signs reviewed    Wt Readings from Last 3 Encounters:   11/16/21 122.1 kg (269 lb 3.2 oz)   09/24/21 117 kg (258 lb)   08/18/21 117 kg (258 lb)        General Appearance:  A well-appearing female in no acute distress.    HEENT: Sclera anicteric, conjunctiva without pallor, moist mucous membranes, normal dentition.   Neck:  Supple without jugular venous distention.  Normal carotid  upstrokes without bruits.   Chest: Clear to auscultation bilaterally with good air movement and respiratory effort and no wheezes, rales, or rhonchi   Cardiac: RRR.  Normal S1 and physiologically split S2, without gallops or rub. No murmurs.   Vascular:  2+ carotid, radial pulses bilaterally  Abdomen: Soft, nontender, nondistended, with normoactive bowel sounds.  No bruits.   Extremities: mild edema, no clubbing, or cyanosis.   Skin: No rash, warm, appropriate for race.   Neuro: Alert and oriented x3. Grossly intact.  CN II-XII intact.  Normal mood and affect.     ECG:   Independent review shows:  NSR, L axis, low voltage    ASSESSMENT/PLAN:    Mobitz I AV block. Possible higher grade AV block. Recommend sleep study as her pause occurred during sleeping hours. Follow for symptomatic bradycardia.    OSA. Suspected. Has risk factors and symptoms. Will refer for sleep study as above.   HL. On statin.     All patient's questions and concerns regarding cardiovascular disease were answered during this visit.    No orders of the defined types  were placed in this encounter.      Return in about 6 months (around 05/16/2022).    Governor Specking, MD  11/16/2021

## 2021-11-25 ENCOUNTER — Telehealth: Payer: Self-pay | Admitting: Vascular Neurology

## 2021-11-25 NOTE — Telephone Encounter (Signed)
Attempted to contact pt to schedule sleep consult with Dr. Stacy Gardner. Pt did not answer. Mychart message sent and not read by pt yet.

## 2021-12-31 ENCOUNTER — Encounter (INDEPENDENT_AMBULATORY_CARE_PROVIDER_SITE_OTHER): Payer: Self-pay

## 2022-04-23 ENCOUNTER — Ambulatory Visit (INDEPENDENT_AMBULATORY_CARE_PROVIDER_SITE_OTHER): Payer: No Typology Code available for payment source | Admitting: Specialist

## 2022-04-23 ENCOUNTER — Encounter (INDEPENDENT_AMBULATORY_CARE_PROVIDER_SITE_OTHER): Payer: Self-pay | Admitting: Specialist

## 2022-04-23 VITALS — BP 135/84 | HR 78 | Temp 97.8°F | Wt 260.2 lb

## 2022-04-23 DIAGNOSIS — Z01411 Encounter for gynecological examination (general) (routine) with abnormal findings: Secondary | ICD-10-CM

## 2022-04-23 DIAGNOSIS — L309 Dermatitis, unspecified: Secondary | ICD-10-CM

## 2022-04-23 MED ORDER — NYSTATIN-TRIAMCINOLONE 100000-0.1 UNIT/GM-% EX CREA
TOPICAL_CREAM | Freq: Two times a day (BID) | CUTANEOUS | 0 refills | Status: DC
Start: 2022-04-23 — End: 2022-05-03

## 2022-04-23 NOTE — Progress Notes (Signed)
Chief Complaint   Patient presents with    Annual Exam     NP ww, pap 2018 neg , mammo 06/04/21- neg, colonoscopy 07/2021 neg, dexa 2022 neg per pt. No complaints        Kristina MERRIHEW is a 61 y.o.  G1P1001, No LMP recorded. Patient is postmenopausal. Here today for annual visit.  Pt denies any PMB, abd pain, abnormal vaginal discharge/itching.  Pt denies any postmenopausal symptoms as well as urinary symptoms.         C/o some irritation of c/s scar. On kenaolog which helps a bit         OB History       Gravida   1    Para   1    Term   1    Preterm        AB        Living   1         SAB        IAB        Ectopic        Multiple        Live Births   1                   PMH:   Past Medical History:   Diagnosis Date    Arthritis     Claustrophobia     Depression     Difficulty walking     walks with cane    Dizziness     when laying flat    Multiple thyroid nodules     noted 2013 admission    Neuromyopathy     lt toes numb    Seasonal allergic rhinitis     Shortness of breath     Wears glasses     for reading             PSH:   Past Surgical History:   Procedure Laterality Date    AX PAIN CLINIC REQUEST Left 11/22/2016    Procedure: AX PAIN CLINIC REQUEST;  Surgeon: Caro Laroche, MD;  Location: [facility];  Service: Anesthesiology;  Laterality: Left;  NEW PT - WILL FAX ORDER BEFORE ARRIVAL..VP    BIOPSY THYROID      benign nodules    COLONOSCOPY, POLYPECTOMY N/A 07/30/2021    Procedure: COLONOSCOPY, POLYPECTOMY;  Surgeon: Leilani Merl, MD;  Location: [facility];  Service: Gastroenterology;  Laterality: N/A;         SH:   Social History     Socioeconomic History    Marital status: Legally Separated   Tobacco Use    Smoking status: Every Day     Packs/day: 0.50     Years: 40.00     Total pack years: 20.00     Types: Cigarettes    Smokeless tobacco: Never   Vaping Use    Vaping Use: Never used   Substance and Sexual Activity    Alcohol use: No    Drug use: Never    Sexual activity: Not Currently         Allergies    Allergen Reactions    Tramadol Rash    Pollen Extract          Meds:   Current Outpatient Medications   Medication Sig Dispense Refill    atorvastatin (LIPITOR) 20 MG tablet Take 1 tablet (20 mg) by mouth daily      clotrimazole (LOTRIMIN) 1 %  cream Apply topically as needed      diclofenac sodium (VOLTAREN) 1 % Gel topical gel Apply topically 4 (four) times daily      ESCITALOPRAM OXALATE PO Take 10 mg by mouth daily         furosemide (LASIX) 40 MG tablet Take 1 tablet (40 mg total) by mouth daily. 30 tablet 3    lidocaine (LIDODERM) 5 % Place 1 patch onto the skin as needed      meclizine (ANTIVERT) 25 MG tablet Take 1 tablet (25 mg) by mouth every 8 (eight) hours as needed      oxyCODONE-acetaminophen (PERCOCET) 10-325 MG per tablet Take 1 tablet by mouth every 6 (six) hours as needed      traZODone (DESYREL) 50 MG tablet as needed         triamcinolone (KENALOG) 0.1 % cream Apply topically as needed      cetirizine (ZYRTEC) 10 MG tablet Take 1 tablet (10 mg total) by mouth daily. (Patient not taking: Reported on 04/23/2022) 90 tablet 0    nystatin-triamcinolone (MYCOLOG II) cream Apply topically 2 (two) times daily 30 g 0     No current facility-administered medications for this visit.       Outpatient Medications Marked as Taking for the 04/23/22 encounter (Office Visit) with Nicholes Rough, DO   Medication Sig Dispense Refill    atorvastatin (LIPITOR) 20 MG tablet Take 1 tablet (20 mg) by mouth daily      clotrimazole (LOTRIMIN) 1 % cream Apply topically as needed      diclofenac sodium (VOLTAREN) 1 % Gel topical gel Apply topically 4 (four) times daily      ESCITALOPRAM OXALATE PO Take 10 mg by mouth daily         furosemide (LASIX) 40 MG tablet Take 1 tablet (40 mg total) by mouth daily. 30 tablet 3    lidocaine (LIDODERM) 5 % Place 1 patch onto the skin as needed      meclizine (ANTIVERT) 25 MG tablet Take 1 tablet (25 mg) by mouth every 8 (eight) hours as needed      oxyCODONE-acetaminophen (PERCOCET)  10-325 MG per tablet Take 1 tablet by mouth every 6 (six) hours as needed      traZODone (DESYREL) 50 MG tablet as needed         triamcinolone (KENALOG) 0.1 % cream Apply topically as needed         Family History   Problem Relation Age of Onset    Heart disease Mother     Heart disease Brother     Heart disease Sister     Kidney disease Father     Heart disease Father     Kidney disease Son         deceased     Heart disease Son     Malignant hyperthermia Neg Hx     Pseudochol deficiency Neg Hx     Breast cancer Neg Hx            Review of Systems -  General ROS: negative for fatigue, fever/chills, weight loss  Breast ROS: negative for breast lumps, nipple discharge  Gastrointestinal ROS: no abdominal pain, change in bowel habits  Genitourinary ROS: no dysuria, trouble voiding, or hematuria  Skin: denies rash or lesion  Psych: denies depression    All other systems were reviewed and are negative except as previously noted in the hpi.    BP 135/84   Pulse 78  Temp 97.8 F (36.6 C)   Wt 260 lb 3.2 oz (118 kg)   BMI 47.59 kg/m       General appearance - alert, well appearing, and in no distress  Breasts: breasts appear normal, symmetrical, no suspicious masses, no skin or nipple changes or axillary nodes, no nipple discharge  Abdomen - soft, nontender, nondistended, no masses, white/red flakes on c/s incision  Extremities: no signs of clubbing or edema     Skin: no visible rashes    Pelvic exam:  VULVA: normal appearing vulva with no masses, tenderness or lesions, normal clitoris  VAGINA: normal appearing vagina with normal color and discharge, no lesions,  CERVIX: normal appearing cervix without discharge or lesions,  UTERUS: uterus is normal size, shape, consistency and nontender,   ADNEXA:  nontender and no masses.  Bladder: non tender  Urethra: no discharge or masses            Plan      1. Dermatitis    2. Encounter for gynecological examination with abnormal finding  - Pap Smear, Thin Prep Method w/ HR  HPV  - Mammo Screening 3D/Tomo Bilateral; Future          -  Well woman exam completed  -  Next mammogram order discussed      - All questions answered          Dr [facility]

## 2022-05-02 ENCOUNTER — Other Ambulatory Visit (INDEPENDENT_AMBULATORY_CARE_PROVIDER_SITE_OTHER): Payer: Self-pay | Admitting: Specialist

## 2022-05-08 LAB — PAP SMEAR, THIN PREP WITH HR HPV: HPV DNA, high risk: NOT DETECTED

## 2022-05-13 ENCOUNTER — Ambulatory Visit (INDEPENDENT_AMBULATORY_CARE_PROVIDER_SITE_OTHER): Payer: No Typology Code available for payment source | Admitting: Cardiovascular Disease

## 2022-07-27 ENCOUNTER — Encounter (INDEPENDENT_AMBULATORY_CARE_PROVIDER_SITE_OTHER): Payer: Self-pay | Admitting: Cardiovascular Disease

## 2022-07-27 ENCOUNTER — Ambulatory Visit (INDEPENDENT_AMBULATORY_CARE_PROVIDER_SITE_OTHER): Payer: No Typology Code available for payment source | Admitting: Cardiovascular Disease

## 2022-07-27 VITALS — BP 143/93 | HR 86 | Ht 62.0 in | Wt 262.4 lb

## 2022-07-27 DIAGNOSIS — E7849 Other hyperlipidemia: Secondary | ICD-10-CM

## 2022-07-27 DIAGNOSIS — R600 Localized edema: Secondary | ICD-10-CM

## 2022-07-27 DIAGNOSIS — I441 Atrioventricular block, second degree: Secondary | ICD-10-CM

## 2022-07-27 LAB — ECG 12-LEAD
Atrial Rate: 70 {beats}/min
IHS MUSE NARRATIVE AND IMPRESSION: NORMAL
P Axis: 36 degrees
P-R Interval: 128 ms
Q-T Interval: 382 ms
QRS Duration: 74 ms
QTC Calculation (Bezet): 412 ms
R Axis: -30 degrees
T Axis: -10 degrees
Ventricular Rate: 70 {beats}/min

## 2022-07-27 NOTE — Progress Notes (Signed)
[facility] VISIT      Chief Complaint   Patient presents with    Second degree atrioventricular block, Mobitz (type)     Dizziness       I had the pleasure of seeing Kristina [name] today for cardiovascular follow up. She is a pleasant 61 y.o. female with a history of obesity, tobacco use, HL, AV block, who presents for continued management.      She has a h/o mobitz I AV block that was found incidentally. She was referred for evaluation so I ordered an event monitor in 08/2021 which showed evidence of higher grade AV block with pauses up to 4.7 seconds during sleeping hours. She has intermittent dizziness vs vertigo and takes meclizine PRN - no recent need for meclizine and symptoms seem to be improved. No h/o syncope. She has chronic LE edema and takes lasix 40mg  daily. No CP with daily activities. She denies unusual SOB with exertion - ambulates with a cane. She has chronic numbness on her L side after a L knee cortisone injection.   She continues to smoke about 5 cigs/day. She has daytimes somnolence.     Echo 10/21/21 showed EF of 55%, mild LVH, no sig valvular disease    Echo 05/02/17 showed EF of 55-60%, no sig valvular disease, ASA without shunt      MEDICATIONS:     Current Outpatient Medications   Medication Sig Dispense Refill    atorvastatin (LIPITOR) 20 MG tablet Take 1 tablet (20 mg) by mouth daily      clotrimazole (LOTRIMIN) 1 % cream Apply topically as needed      diclofenac sodium (VOLTAREN) 1 % Gel topical gel Apply topically 4 (four) times daily      ESCITALOPRAM OXALATE PO Take 10 mg by mouth daily         furosemide (LASIX) 40 MG tablet Take 1 tablet (40 mg total) by mouth daily. 30 tablet 3    lidocaine (LIDODERM) 5 % Place 1 patch onto the skin as needed      meclizine (ANTIVERT) 25 MG tablet Take 1 tablet (25 mg) by mouth every 8 (eight) hours as needed      oxyCODONE-acetaminophen (PERCOCET) 5-325 MG per tablet TAKE 1/2 TO 1 FULL TABLET BY MOUTH EVERY 12 HOURS AS NEEDED FOR PAIN  WITH FOOD AND NONALCOHOLIC BEVERAGES      traZODone (DESYREL) 50 MG tablet as needed         triamcinolone (KENALOG) 0.1 % cream Apply topically as needed       No current facility-administered medications for this visit.       REVIEW OF SYSTEMS: All other systems reviewed and negative except as above.    PHYSICAL EXAMINATION  Vital Signs: BP (!) 143/93 (BP Site: Right arm, Patient Position: Sitting, Cuff Size: Large)   Pulse 86   Ht 1.575 m (5\' 2" )   Wt 119 kg (262 lb 6.4 oz)   SpO2 97%   BMI 47.99 kg/m    Vital signs reviewed    Wt Readings from Last 3 Encounters:   07/27/22 119 kg (262 lb 6.4 oz)   04/23/22 118 kg (260 lb 3.2 oz)   11/16/21 122.1 kg (269 lb 3.2 oz)        General Appearance:  A well-appearing female in no acute distress.    HEENT: Sclera anicteric, conjunctiva without pallor, moist mucous membranes, normal dentition.   Neck:  Supple without jugular venous distention.  Normal carotid upstrokes without bruits.   Chest: Clear to auscultation bilaterally with good air movement and respiratory effort and no wheezes, rales, or rhonchi   Cardiac: RRR.  Normal S1 and physiologically split S2, without gallops or rub. No murmurs.   Vascular:  2+ carotid, radial pulses bilaterally  Abdomen: Soft, nontender, nondistended, with normoactive bowel sounds.  No bruits.   Extremities: mild edema, no clubbing, or cyanosis.   Skin: No rash, warm, appropriate for race.   Neuro: Alert and oriented x3. Grossly intact.  CN II-XII intact.  Normal mood and affect.     ECG:   Independent review shows:  NSR, L axis, low voltage    ASSESSMENT/PLAN:    Mobitz I AV block. Possible higher grade AV block. She was referred for a sleep study as her pause occurred during sleeping hours but she did not follow through. Follow for symptomatic bradycardia.    OSA. Suspected. Has risk factors and symptoms. Referred for sleep study as above.   HL. On statin.   HTN. BP elevated today. Cont to follow.     All patient's questions and  concerns regarding cardiovascular disease were answered during this visit.    Orders Placed This Encounter   Procedures    ECG 12 lead       Return in about 1 year (around 07/28/2023).    Carolynne Edouard, MD  07/27/2022

## 2022-07-29 ENCOUNTER — Other Ambulatory Visit: Payer: Self-pay | Admitting: Family Medicine

## 2022-07-29 DIAGNOSIS — Z1231 Encounter for screening mammogram for malignant neoplasm of breast: Secondary | ICD-10-CM

## 2022-07-30 ENCOUNTER — Telehealth (INDEPENDENT_AMBULATORY_CARE_PROVIDER_SITE_OTHER): Payer: Self-pay | Admitting: Gastroenterology

## 2022-07-30 ENCOUNTER — Encounter (INDEPENDENT_AMBULATORY_CARE_PROVIDER_SITE_OTHER): Payer: Self-pay

## 2022-07-30 NOTE — Telephone Encounter (Signed)
Called pt to schedule her repeat CLN and I did not get an answer. I left a detailed vm.

## 2022-07-30 NOTE — Telephone Encounter (Signed)
-----   Message from [place] sent at 12/10/2021  4:47 PM EST -----  Regarding: colonoscopy  Please set colonoscopy recall for 1 year

## 2022-08-06 ENCOUNTER — Ambulatory Visit
Admission: RE | Admit: 2022-08-06 | Discharge: 2022-08-06 | Disposition: A | Discharge status: Home / Self Care | Payer: No Typology Code available for payment source | Source: Ambulatory Visit | Attending: Family Medicine | Admitting: Family Medicine

## 2022-08-06 ENCOUNTER — Other Ambulatory Visit: Payer: Self-pay | Admitting: Family Medicine

## 2022-08-06 DIAGNOSIS — E669 Obesity, unspecified: Secondary | ICD-10-CM | POA: Insufficient documentation

## 2022-08-06 DIAGNOSIS — R7303 Prediabetes: Secondary | ICD-10-CM | POA: Insufficient documentation

## 2022-08-06 DIAGNOSIS — E559 Vitamin D deficiency, unspecified: Secondary | ICD-10-CM | POA: Insufficient documentation

## 2022-08-06 DIAGNOSIS — E7800 Pure hypercholesterolemia, unspecified: Secondary | ICD-10-CM | POA: Insufficient documentation

## 2022-08-06 DIAGNOSIS — D649 Anemia, unspecified: Secondary | ICD-10-CM | POA: Insufficient documentation

## 2022-08-06 DIAGNOSIS — Z1231 Encounter for screening mammogram for malignant neoplasm of breast: Secondary | ICD-10-CM

## 2022-08-06 LAB — CBC AND DIFFERENTIAL
Absolute NRBC: 0 10*3/uL (ref 0.00–0.00)
Basophils Absolute Automated: 0.08 10*3/uL (ref 0.00–0.08)
Basophils Automated: 1.4 %
Eosinophils Absolute Automated: 0.21 10*3/uL (ref 0.00–0.44)
Eosinophils Automated: 3.7 %
Hematocrit: 34.4 % — ABNORMAL LOW (ref 34.7–43.7)
Hgb: 10.7 g/dL — ABNORMAL LOW (ref 11.4–14.8)
Immature Granulocytes Absolute: 0.01 10*3/uL (ref 0.00–0.07)
Immature Granulocytes: 0.2 %
Instrument Absolute Neutrophil Count: 2.59 10*3/uL (ref 1.10–6.33)
Lymphocytes Absolute Automated: 2.51 10*3/uL (ref 0.42–3.22)
Lymphocytes Automated: 44 %
MCH: 28.6 pg (ref 25.1–33.5)
MCHC: 31.1 g/dL — ABNORMAL LOW (ref 31.5–35.8)
MCV: 92 fL (ref 78.0–96.0)
MPV: 10.1 fL (ref 8.9–12.5)
Monocytes Absolute Automated: 0.31 10*3/uL (ref 0.21–0.85)
Monocytes: 5.4 %
Neutrophils Absolute: 2.59 10*3/uL (ref 1.10–6.33)
Neutrophils: 45.3 %
Nucleated RBC: 0 /100 WBC (ref 0.0–0.0)
Platelets: 359 10*3/uL — ABNORMAL HIGH (ref 142–346)
RBC: 3.74 10*6/uL — ABNORMAL LOW (ref 3.90–5.10)
RDW: 14 % (ref 11–15)
WBC: 5.71 10*3/uL (ref 3.10–9.50)

## 2022-08-06 LAB — COMPREHENSIVE METABOLIC PANEL
ALT: 5 U/L (ref 0–55)
AST (SGOT): 12 U/L (ref 5–41)
Albumin/Globulin Ratio: 1.1 (ref 0.9–2.2)
Albumin: 3.4 g/dL — ABNORMAL LOW (ref 3.5–5.0)
Alkaline Phosphatase: 60 U/L (ref 37–117)
Anion Gap: 7 (ref 5.0–15.0)
BUN: 10 mg/dL (ref 7.0–21.0)
Bilirubin, Total: 0.3 mg/dL (ref 0.2–1.2)
CO2: 29 mEq/L (ref 17–29)
Calcium: 9.2 mg/dL (ref 8.5–10.5)
Chloride: 105 mEq/L (ref 99–111)
Creatinine: 0.7 mg/dL (ref 0.4–1.0)
Globulin: 3.2 g/dL (ref 2.0–3.6)
Glucose: 90 mg/dL (ref 70–100)
Potassium: 4.2 mEq/L (ref 3.5–5.3)
Protein, Total: 6.6 g/dL (ref 6.0–8.3)
Sodium: 141 mEq/L (ref 135–145)
eGFR: >60 mL/min/{1.73_m2} (ref >=60)

## 2022-08-06 LAB — HEMOGLOBIN A1C
Average Estimated Glucose: 116.9 mg/dL
Hemoglobin A1C: 5.7 % — ABNORMAL HIGH (ref 4.6–5.6)

## 2022-08-06 LAB — LIPID PANEL
Cholesterol / HDL Ratio: 4.4 Index
Cholesterol: 172 mg/dL (ref 0–199)
HDL: 39 mg/dL — ABNORMAL LOW (ref 40–9999)
LDL Calculated: 115 mg/dL — ABNORMAL HIGH (ref 0–99)
Triglycerides: 89 mg/dL (ref 34–149)
VLDL Calculated: 18 mg/dL (ref 10–40)

## 2022-08-06 LAB — VITAMIN B12: Vitamin B-12: 292 pg/mL (ref 211–911)

## 2022-08-06 LAB — TSH: TSH: 1.83 u[IU]/mL (ref 0.35–4.94)

## 2022-08-06 LAB — FERRITIN: Ferritin: 27.8 ng/mL (ref 4.60–204.00)

## 2022-08-06 LAB — HEMOLYSIS INDEX: Hemolysis Index: 0 Index (ref 0–24)

## 2022-08-06 LAB — VITAMIN D,25 OH,TOTAL: Vitamin D, 25 OH, Total: 15 ng/mL — ABNORMAL LOW (ref 30–100)

## 2022-08-08 LAB — IRON PROFILE
Iron Saturation: 17 % (calc) (ref 16–45)
Iron: 54 ug/dL (ref 45–160)
TIBC: 326 mcg/dL (calc) (ref 250–450)

## 2022-08-30 ENCOUNTER — Other Ambulatory Visit: Payer: Self-pay | Admitting: Neurological Surgery

## 2022-08-30 DIAGNOSIS — M25569 Pain in unspecified knee: Secondary | ICD-10-CM

## 2022-10-04 ENCOUNTER — Telehealth (INDEPENDENT_AMBULATORY_CARE_PROVIDER_SITE_OTHER): Payer: Self-pay | Admitting: Cardiovascular Disease

## 2022-10-04 NOTE — Telephone Encounter (Signed)
I received a call from the patient who advised she Dr. Willaim Bane wanted her to have a sleep study but she did not know where to go. I advised the referral for the Sleep Study  was put in by Dr. Evangeline Gula but expired on 04/06/22 and she was referred to the Sleep Center at [facility]. Please have referral  renewed so that patient can schedule appt. [facility] number given to patient.

## 2022-10-05 ENCOUNTER — Other Ambulatory Visit (INDEPENDENT_AMBULATORY_CARE_PROVIDER_SITE_OTHER): Payer: Self-pay | Admitting: Cardiovascular Disease

## 2022-10-05 ENCOUNTER — Telehealth (INDEPENDENT_AMBULATORY_CARE_PROVIDER_SITE_OTHER): Payer: Self-pay

## 2022-10-05 DIAGNOSIS — G4733 Obstructive sleep apnea (adult) (pediatric): Secondary | ICD-10-CM

## 2022-10-05 NOTE — Telephone Encounter (Signed)
Spoke to pt. Informed pt of ordered referral for sleep study by Dr. Willaim Bane. Gave pt number listed on referral to schedule sleep study. Pt verbalized understanding. No further questions.

## 2022-10-05 NOTE — Progress Notes (Signed)
Spoke to pt. Informed pt of ordered referral for sleep study by Dr. Park. Gave pt number listed on referral to schedule sleep study. Pt verbalized understanding. No further questions.

## 2022-10-20 ENCOUNTER — Encounter (INDEPENDENT_AMBULATORY_CARE_PROVIDER_SITE_OTHER): Payer: Self-pay | Admitting: Cardiovascular Disease

## 2022-10-20 ENCOUNTER — Ambulatory Visit (INDEPENDENT_AMBULATORY_CARE_PROVIDER_SITE_OTHER): Payer: No Typology Code available for payment source | Admitting: Cardiovascular Disease

## 2022-10-20 VITALS — BP 138/79 | HR 78 | Ht 62.0 in | Wt 262.0 lb

## 2022-10-20 DIAGNOSIS — I455 Other specified heart block: Secondary | ICD-10-CM

## 2022-10-20 DIAGNOSIS — E7849 Other hyperlipidemia: Secondary | ICD-10-CM

## 2022-10-20 MED ORDER — ATORVASTATIN CALCIUM 40 MG PO TABS
40.0000 mg | ORAL_TABLET | Freq: Every day | ORAL | 3 refills | Status: AC
Start: 2022-10-20 — End: ?

## 2022-10-20 NOTE — Progress Notes (Signed)
[facility] office visit      I had the pleasure of seeing Kristina [name] today for cardiovascular follow up. She is a pleasant 62 y.o. with tobacco use, morbid obesity, HPL,AV block here for cardiac evaluation management    She follows up with Dr. Hampton Abbot    History of AV block  Mobitz type I AV block noted incidentally on EKG on the patient  Event monitor 08/2021 with high degree AV block and pauses up to 4.7 seconds during sleep  No syncope  Being managed conservatively and is off AV blockers      She has  been unable to make an appointment for sleep study yet and is requesting help  Continues to have vertigo.  No syncope    Her exercise tolerance is poor due to back pain  She is chronic lower extremity edema and has been on furosemide for 3 years    Denies any chest pain  Likely short of breath if she exerts herself but she is unable to because of back pain    MEDICATIONS:   Current Outpatient Medications   Medication Instructions    atorvastatin (LIPITOR) 40 mg, Oral, Daily    clotrimazole (LOTRIMIN) 1 % cream Topical, As needed    diclofenac sodium (VOLTAREN) 1 % Gel topical gel Topical, 4 times daily    escitalopram (LEXAPRO) 10 mg, Oral, Daily    furosemide (LASIX) 40 mg, Oral, Daily    lidocaine (LIDODERM) 5 % 1 patch, Transdermal, As needed    meclizine (ANTIVERT) 25 mg, Oral, Every 8 hours PRN    naproxen (NAPROSYN) 500 mg, Oral, 2 times daily with meals    oxyCODONE-acetaminophen (PERCOCET) 5-325 MG per tablet TAKE 1/2 TO 1 FULL TABLET BY MOUTH EVERY 12 HOURS AS NEEDED FOR PAIN WITH FOOD AND NONALCOHOLIC BEVERAGES    traZODone (DESYREL) 50 MG tablet As needed    triamcinolone (KENALOG) 0.1 % cream Topical, As needed            REVIEW OF SYSTEMS: All other systems reviewed and negative except as above.    PHYSICAL EXAMINATION  General Appearance: A well-appearing woman in no acute distress.    Vital Signs: BP 138/79 (BP Site: Left arm, Patient Position: Sitting, Cuff Size: Large)    Pulse 78   Ht 1.575 m (5\' 2" )   Wt 118.8 kg (262 lb)   SpO2 98%   BMI 47.92 kg/m       BP Readings from Last 3 Encounters:   10/20/22 138/79   07/27/22 (!) 143/93   04/23/22 135/84     Wt Readings from Last 3 Encounters:   10/20/22 118.8 kg (262 lb)   07/27/22 119 kg (262 lb 6.4 oz)   04/23/22 118 kg (260 lb 3.2 oz)      Body mass index is 47.92 kg/m.        Neck:  Supple without jugular venous distention.   Chest: Good air movement and respiratory effort  Bilaterally. Clear to auscultation. No wheezes, rales, or rhonchi   Cardiovascular: Normal S1 and S2 . No murmurs, gallops or rub. PMI nondisplaced.   Extremities: Warm . 1+ edema,  DP 2+ b/l   Skin: No rash,    Neuro:  Grossly intact.    Psych:  Normal mood and affect.       LABS: ( personally reviewed by me)  Basic Metabolic Profile   Lab Results   Component Value Date    NA 141 08/06/2022  K 4.2 08/06/2022    BUN 10.0 08/06/2022    CREAT 0.7 08/06/2022    EGFR >60.0 08/06/2022    GLU 90 08/06/2022    MG 1.7 10/20/2021    CA 9.2 08/06/2022         CBC :   Lab Results   Component Value Date    WBC 5.71 08/06/2022    HGB 10.7 (L) 08/06/2022    HCT 34.4 (L) 08/06/2022    PLT 359 (H) 08/06/2022         Cholesterol Panel   Lab Results   Component Value Date    CHOL 172 08/06/2022    HDL 39 (L) 08/06/2022    LDL 115 (H) 08/06/2022    TRIG 89 08/06/2022        Diabetes and Thyroid   Lab Results   Component Value Date    HGBA1C 5.7 (H) 08/06/2022    TSH 1.83 08/06/2022         Coagulation Studies   Lab Results   Component Value Date    DDIMER 0.47 01/18/2014                  Cardiac Biomarkers   Lab Results   Component Value Date    TROPI <0.01 01/18/2014    BNP 37 01/18/2014          PROCEEDURES:( personally reviewed by me)    ECHO:  10/21/2021  * Left ventricular systolic function is normal with an ejection fraction by  Biplane Method of Discs of  55 %.    * There is mild concentric left ventricular hypertrophy.    * No significant valvular  dysfunction.    Stress test   01/2014:   A normal distribution radiotracer seen throughout the left  ventricle and both stress and redistribution images. The ejection  fraction is normal at 65%.    Impression    Normal study.         ASSESSMENT/PLAN:      AV block  Incidental Mobitz type I AV block but high degree AV block during  sleep suspected secondary to sleep apnea  Patient has not had a sleep study  I reached out to Dr. Lance Coon to have her get in to seeing him for sleep study    Hypertension: New diagnosis  Patient will continue to monitor blood pressure readings  She will likely need pharmacological treatment next office visit if continues to be high  May be better with sleep apnea treatment    Suspected OSA  Sleep referral provided and contact made    Hyperlipidemia tolerating statins.  Lipids reviewed and LDL at 115 from 07/2022  Recommend increasing atorvastatin to 40 mg daily        All questions regarding cardiovascular diseases were answered during this encounter.      Orders Placed This Encounter   Procedures    Lipid panel    Referral to Neurology - Sleep Medicine ([place])     Return in about 6 months (around 04/20/2023).  She will follow-up with Dr. Elizebeth Koller, MD  10/20/2022

## 2022-10-29 ENCOUNTER — Telehealth: Payer: Self-pay | Admitting: Sleep Medicine

## 2022-10-29 NOTE — Telephone Encounter (Signed)
Spoke with pt to obtain any previous sleep studies or CPAP compliance. Pt denies any previous studies with or CPAP usage. Confirmed date and time of appt with pt.

## 2022-11-02 ENCOUNTER — Encounter (INDEPENDENT_AMBULATORY_CARE_PROVIDER_SITE_OTHER): Payer: Self-pay

## 2022-11-02 ENCOUNTER — Telehealth (INDEPENDENT_AMBULATORY_CARE_PROVIDER_SITE_OTHER): Payer: No Typology Code available for payment source | Admitting: Sleep Medicine

## 2022-11-02 ENCOUNTER — Encounter (INDEPENDENT_AMBULATORY_CARE_PROVIDER_SITE_OTHER): Payer: Self-pay | Admitting: Sleep Medicine

## 2022-11-02 VITALS — Ht 62.0 in | Wt 250.0 lb

## 2022-11-02 DIAGNOSIS — F1190 Opioid use, unspecified, uncomplicated: Secondary | ICD-10-CM

## 2022-11-02 DIAGNOSIS — G4733 Obstructive sleep apnea (adult) (pediatric): Secondary | ICD-10-CM

## 2022-11-02 DIAGNOSIS — I455 Other specified heart block: Secondary | ICD-10-CM

## 2022-11-02 DIAGNOSIS — I441 Atrioventricular block, second degree: Secondary | ICD-10-CM

## 2022-11-02 NOTE — Progress Notes (Signed)
[place] MEDICINE TELEMEDICINE VISIT    Date of Clinic Appointment: November 02, 2022    Kristina [name]  [id number]  [birth year]  [address] [place] [address] [address]    Primary Care Provider:  Ferdie Ping, MD  [place]  [place] [address]  [phone]   [phone]    Referring Provider:  Rayburn Felt, Shanon Brow, MD  [place]  [place]  [place],  [place] [address]    Telemedicine visit with video initiated. This information was discussed with the patient, who gave verbal consent to proceed with a telemedicine appointment.  This visit had to be started on [email or web address] because patient had technical difficulties connecting on epic. Then we after 10 mins on doxy we had technical difficulties so it had to be finsihed with a phone call    Chief Complaint   Mobitz I AV block. Referred from cardiology with Concern for sleep apnea.    History of Present Illness   She has been seeing cardiology for Mobitz I AV block and prolonged holter showed up to 4.7 second pauses at night while sleeping. Cardiology is concerned for possible higher grade block. Here is a copy of Dr. Allen Kell (cardiology) 07/2022 note "Possible higher grade AV block. She was referred for a sleep study as her pause occurred during sleeping hours but she did not follow through. Follow for symptomatic bradycardia."      They endorse the following symptoms:  [x]  Habitual snoring  []  Apneas witnessed by bed partner  []  Awakens feeling unrefreshed by sleep  []  Excessive daytime sleepiness  []  Excessive daytime fatigue  []  Restlessness during sleep  []  Sudden awakenings with a sensation of gasping or choking  []  Dry mouth or sore throat upon awakening  []  Intellectual impairment, such as trouble concentrating, forgetfulness, or irritability  []  Excessive nighttime urinary frequency  []  Night sweats  []  Morning headaches on waking    She used to have trazodone for insomnia after her son and mother passed away but she  rarely uses it not.  Subjectively, she thinks she sleeps peacefully.  She has been snoring for a long time.   She denies trouble falling or staying asleep.  She goes to bed at 8p. Watching a movie.  Falls asleep 10p.   Wakes at 3a to use bathroom.  Final wake time 7a.  She feels reasonably refreshed on waking.    She will doze during the day if just sitting watching TV.      EPWORTH SLEEPINESS SCALE    How Likely Are You to Fall Asleep    Sitting and reading?      2 - Moderate chance of dozing or sleeping  Watching TV?       2 - Moderate chance of dozing or sleeping  Sitting inactive in a public place?    0 - Never doze or sleep  As a passenger in a car for an hour or more?  0 - Never doze or sleep  Lying down to rest in the afternoon?    3 - High chance of dozing or sleeping  Sitting and talking to someone?    0 - Never doze or sleep  Sitting quietly after lunch without alcohol?   2 - Moderate chance of dozing or sleeping  Stopped for a few minutes at a traffic light?   0 - Never doze or sleep    Total Score  9   Score 0-9 = Not sleepy   Score 10-17  = Moderate sleepy   Score 18+  = Very sleepy    STOP-BANG QUESTIONNAIRE    Do you snore loudly (louder than talking    YES  or loud enough to be heard through closed doors)?     Do you often feel tired, fatigued, or sleepy during daytime?  YES    Has anyone observed you stop breathing during your sleep?               No    Do you have or are you being treated for high blood pressure? YES    Is your BMI more than 35 kg/m2?     YES    Are you over 69 years old?      YES    Is your neck circumference greater than 40cm?   YES    Are you female?                     No    High risk of OSA: answering yes to three or more items  Low risk of OSA: answering yes to less than three items      Ancillary Data:  Her previous sleep studies and medical records were reviewed with the patient.     PRIOR SLEEP TESTING:     no        The following portions of the patient's history were  reviewed and updated as appropriate: allergies, current medications, past family history, past medical history, past social history, past surgical history and problem list.     Review of Systems    Constitutional: Negative for fever.   Respiratory: Negative for shortness of breath.    Cardiovascular: Negative for chest pain.   Gastrointestinal: Negative for abdominal pain.   Neurological: Negative for dizziness, weakness, numbness and headaches.   Psychiatric/Behavioral: Negative for depression.   All other systems reviewed and are negative except pertinent positives as noted above in HPI.       Past Medical History     Past Medical History:   Diagnosis Date    Arthritis     Claustrophobia     Depression     Difficulty walking     walks with cane    Dizziness     when laying flat    Multiple thyroid nodules     noted 2013 admission    Neuromyopathy     lt toes numb    Seasonal allergic rhinitis     Shortness of breath     Wears glasses     for reading         Medications     Current Outpatient Medications on File Prior to Visit   Medication Sig Dispense Refill    atorvastatin (LIPITOR) 40 MG tablet Take 1 tablet (40 mg) by mouth daily 90 tablet 3    clotrimazole (LOTRIMIN) 1 % cream Apply topically as needed      diclofenac sodium (VOLTAREN) 1 % Gel topical gel Apply topically 4 (four) times daily      escitalopram (LEXAPRO) 10 MG tablet Take 1 tablet (10 mg) by mouth daily      furosemide (LASIX) 40 MG tablet Take 1 tablet (40 mg total) by mouth daily. 30 tablet 3    lidocaine (LIDODERM) 5 % Place 1 patch onto the skin as needed      meclizine (ANTIVERT) 25 MG tablet Take 1  tablet (25 mg) by mouth every 8 (eight) hours as needed      naproxen (NAPROSYN) 500 MG tablet Take 1 tablet (500 mg) by mouth 2 (two) times daily with meals      oxyCODONE-acetaminophen (PERCOCET) 5-325 MG per tablet TAKE 1/2 TO 1 FULL TABLET BY MOUTH EVERY 12 HOURS AS NEEDED FOR PAIN WITH FOOD AND NONALCOHOLIC BEVERAGES      traZODone  (DESYREL) 50 MG tablet as needed         triamcinolone (KENALOG) 0.1 % cream Apply topically as needed       No current facility-administered medications on file prior to visit.          Social History       Alcoholic beverages: no  Caffeine intake: a cold glass of tea in morning, one hot tea during day, coke  twice/wk  Occupational History: on disability        Physical Examination     Exam limited due to video visit.    Constitutional: She is well-nourished, well-appearing, and in no acute distress.    Neurologic: She is awake, alert, and oriented x 3.      Psychiatric: Alert, interactive, and appropriate mood and affect normal        Data Review       Laboratory Studies:   Lab Results   Component Value Date    WBC 5.71 08/06/2022    HGB 10.7 (L) 08/06/2022    HCT 34.4 (L) 08/06/2022    PLT 359 (H) 08/06/2022    CHOL 172 08/06/2022    TRIG 89 08/06/2022    HDL 39 (L) 08/06/2022    ALT 5 08/06/2022    AST 12 08/06/2022    NA 141 08/06/2022    K 4.2 08/06/2022    CL 105 08/06/2022    BUN 10.0 08/06/2022    CO2 29 08/06/2022    TSH 1.83 08/06/2022    HGBA1C 5.7 (H) 08/06/2022     Lab Results   Component Value Date    FERRITIN 27.80 08/06/2022    IRON 54 08/06/2022    TIBC 326 08/06/2022             Assessment       Kristina [name] is a 62 y.o. presents for evaluation of:    Symptoms concerning for sleep apnea. Evaluate for obstructive sleep apnea.  Morbid obesity (BMI 45)  Concern for obesity hypoventilation given BMI (45) and chronically elevated serum bicarbonate  > 28 (32 10/20/2021, 29 08/06/2022)  Symptomatic heart block  Chronic narcotic use for chronic pain (since 2016)  Chronic L sided paresthesias since a cortisone shot    Plan        A PSG is required to evaluate for sleep apnea in this patient because:  This patient has chronic issues with sustained bradyarrhythmias. Long nocturnal pauses documented.  Known Mobitz I AV block. Suspected higher degree block by cardiology.  Concern for OHS with chronically  elevated serum bicarbonate and morbid obesity  Chronic prescription narcotic use for chronic pain    2.  Weight loss:   We discussed the relationship of body weight and obstructive sleep apnea severity and how changes in body weight could affect sleep apnea severity. I counseled the patient on the benefits of weight loss on sleep apnea and its related symptoms. I recommend weight loss to goal BMI <= 25.       - Discussed avoid driving or engaging in operating heavy  machinery if drowsy.    The patient will follow-up with [place] after sleep study to discuss results and further treatment plan.      It is a pleasure to participate in the care of this patient.  Please feel free to contact me with any questions or concerns.      Sincerely,       Terri Skains, MD    [facility]  [facility]  Board Certified, Neurology  Board Eligible, Sleep Medicine [phone])          The total time spent on the day of service in preparing to see the patient: chart review, obtaining and/or reviewing the separately obtained history, performing a medically appropriate examination and/or evaluation , counseling and educating the patient/family/caregiver, ordering medications, tests, or procedures, and documenting clinical information in the electronic or other health records was 55 minutes.

## 2022-11-02 NOTE — Patient Instructions (Signed)
I have ordered an in-lab sleep test to assess your sleep complaints. Please call our call center to schedule your home or in-lab sleep test.    The centralized number is [phone].    We have three sleep lab locations:   [place]  [place]  [place]

## 2022-11-19 ENCOUNTER — Ambulatory Visit: Payer: No Typology Code available for payment source

## 2022-12-10 ENCOUNTER — Encounter (INDEPENDENT_AMBULATORY_CARE_PROVIDER_SITE_OTHER): Payer: Self-pay

## 2022-12-10 ENCOUNTER — Telehealth (INDEPENDENT_AMBULATORY_CARE_PROVIDER_SITE_OTHER): Payer: Self-pay | Admitting: Cardiovascular Disease

## 2022-12-10 NOTE — Telephone Encounter (Signed)
This patient would like [place] to request her labs drawn (drawn on 1/25) at [facility]    Phone: [phone], opt. [place]

## 2022-12-10 NOTE — Telephone Encounter (Signed)
Requested and scanned in.

## 2022-12-10 NOTE — Telephone Encounter (Addendum)
Error-needs to be routed to Dr. Hampton Abbot.    [facility]  [facility]

## 2022-12-21 ENCOUNTER — Encounter (INDEPENDENT_AMBULATORY_CARE_PROVIDER_SITE_OTHER): Payer: Self-pay

## 2022-12-21 ENCOUNTER — Ambulatory Visit: Payer: No Typology Code available for payment source | Attending: Sleep Medicine

## 2022-12-21 DIAGNOSIS — I441 Atrioventricular block, second degree: Secondary | ICD-10-CM | POA: Insufficient documentation

## 2022-12-21 DIAGNOSIS — F1190 Opioid use, unspecified, uncomplicated: Secondary | ICD-10-CM | POA: Insufficient documentation

## 2022-12-21 DIAGNOSIS — I455 Other specified heart block: Secondary | ICD-10-CM | POA: Insufficient documentation

## 2022-12-21 DIAGNOSIS — G4733 Obstructive sleep apnea (adult) (pediatric): Secondary | ICD-10-CM | POA: Insufficient documentation

## 2022-12-31 NOTE — Progress Notes (Signed)
Polysomnogram Report    Study Date: 12/21/2022    Referring Clinician:  Caryn Section, MD    Indication:  This is an overnight polysomnogram on this 62 year old patient with clinical history of heart block, snoring, witnessed apneas. Evaluate for sleep apnea.    No previous sleep study is available for review.    Scoring Criteria:  Hypopneas scored according to 4% rule      Report:  During this polysomnogram, the total recording time (TRT) was 510.5 minutes, and the total sleep time (TST) was 423 minutes, resulting in a sleep efficiency of 82.9%. The sleep latency was 12 minutes, the wake after sleep onset (WASO) was 75.5 minutes, and the REM latency was 86.5 minutes.  The overall AHI was 18.6/hr, REM AHI was 28.3/hr, NREM AHI was 16.9/hr, supine AHI was 15.7/hr, and lateral AHI was 20.4/hr. The apnea/hypopnea breakdown was as follows: 38 obstructive apneas, 0 mixed apneas, 0 central apneas, 93 hypopneas. During sleep, the mean SaO2 was 92.2% with an SaO2 nadir of 73%. The percentage and minutes of TST with an SaO2 <= 88% were 3.1% and 15.7 minute(s), respectively. The desaturation index was 24.4/hr. The arousal index was 53.2/hr. Supplemental oxygen was not utilized during the study. The overall periodic limb movement index was 0.6/hr with a periodic limb movement arousal index of 0.1/hr. The single-lead EKG showed an average pulse rate of 82.6/min. A regular, narrow complex rhythm was observed.    Interpretation:  This polysomnogram demonstrates that:  1) Moderate obstructive sleep apnea was noted (AHI 18.6/hr).  2) Severe desaturations noted with obstructive respiratory events (min SaO2 73%).  3) Heavy bruxism noted.    Clinical Correlation:  The findings are consistent with moderate obstructive sleep apnea (AHI 18.6/hr).    This degree of sleep apnea can be associated with excessive daytime sleepiness and cardiovascular morbidity.  Given these findings, PAP therapy is recommended, and given the severe desaturations  it is recommended to initiate PAP via in-lab PAP titration study.    If the patient does not desire PAP therapy or cannot tolerate PAP, oral appliance therapy (OAT), hypoglossal nerve stimulator therapy (Inspire device), or ENT consultation for other surgical treatment of sleep apnea can be considered.  For all patients with sleep apnea, weight loss is recommended to a goal BMI < 25 to reduce apnea severity and symptoms.  Query patient about bruxism and recommend she discuss treatment with her dentist.    Caryn Section, MD  Board Certified, Neurology  Board Certified, [place] [facility]

## 2023-01-03 ENCOUNTER — Encounter (INDEPENDENT_AMBULATORY_CARE_PROVIDER_SITE_OTHER): Payer: Self-pay

## 2023-01-24 ENCOUNTER — Ambulatory Visit (INDEPENDENT_AMBULATORY_CARE_PROVIDER_SITE_OTHER): Payer: No Typology Code available for payment source | Admitting: Cardiovascular Disease

## 2023-01-24 NOTE — Progress Notes (Unsigned)
[facility] VISIT      No chief complaint on file.      I had the pleasure of seeing [name] today for cardiovascular follow up. She is a pleasant 62 y.o. female with a history of obesity, tobacco use, HL, AV block, OSA, who presents for continued management.      She has a h/o mobitz I AV block that was found incidentally. She was referred for evaluation so I ordered an event monitor in 08/2021 which showed evidence of higher grade AV block with pauses up to 4.7 seconds during sleeping hours. She has intermittent dizziness vs vertigo and takes meclizine PRN - no recent need for meclizine and symptoms seem to be improved. No h/o syncope. I referred her for a sleep study which she only recently had last month and was diagnosed with moderate OSA with severe desaturations down to 73%. .      She has chronic LE edema and takes lasix 40mg  daily. No CP with daily activities. She denies unusual SOB with exertion - ambulates with a cane. She has chronic numbness on her L side after a L knee cortisone injection.   She continues to smoke about 5 cigs/day.     Echo 10/21/21 showed EF of 55%, mild LVH, no sig valvular disease    Echo 05/02/17 showed EF of 55-60%, no sig valvular disease, ASA without shunt      MEDICATIONS:     Current Outpatient Medications   Medication Sig Dispense Refill    atorvastatin (LIPITOR) 40 MG tablet Take 1 tablet (40 mg) by mouth daily 90 tablet 3    clotrimazole (LOTRIMIN) 1 % cream Apply topically as needed      diclofenac sodium (VOLTAREN) 1 % Gel topical gel Apply topically 4 (four) times daily      escitalopram (LEXAPRO) 10 MG tablet Take 1 tablet (10 mg) by mouth daily      furosemide (LASIX) 40 MG tablet Take 1 tablet (40 mg total) by mouth daily. 30 tablet 3    lidocaine (LIDODERM) 5 % Place 1 patch onto the skin as needed      meclizine (ANTIVERT) 25 MG tablet Take 1 tablet (25 mg) by mouth every 8 (eight) hours as needed      naproxen (NAPROSYN) 500 MG tablet Take 1  tablet (500 mg) by mouth 2 (two) times daily with meals      oxyCODONE-acetaminophen (PERCOCET) 5-325 MG per tablet TAKE 1/2 TO 1 FULL TABLET BY MOUTH EVERY 12 HOURS AS NEEDED FOR PAIN WITH FOOD AND NONALCOHOLIC BEVERAGES      traZODone (DESYREL) 50 MG tablet as needed         triamcinolone (KENALOG) 0.1 % cream Apply topically as needed (Patient not taking: Reported on 11/02/2022)       No current facility-administered medications for this visit.       REVIEW OF SYSTEMS: All other systems reviewed and negative except as above.    PHYSICAL EXAMINATION  Vital Signs: There were no vitals taken for this visit.   Vital signs reviewed    Wt Readings from Last 3 Encounters:   11/02/22 113.4 kg (250 lb)   10/20/22 118.8 kg (262 lb)   07/27/22 119 kg (262 lb 6.4 oz)        General Appearance:  A well-appearing female in no acute distress.    HEENT: Sclera anicteric, conjunctiva without pallor, moist mucous membranes, normal dentition.   Neck:  Supple without  jugular venous distention.  Normal carotid upstrokes without bruits.   Chest: Clear to auscultation bilaterally with good air movement and respiratory effort and no wheezes, rales, or rhonchi   Cardiac: RRR.  Normal S1 and physiologically split S2, without gallops or rub. No murmurs.   Vascular:  2+ carotid, radial pulses bilaterally  Abdomen: Soft, nontender, nondistended, with normoactive bowel sounds.  No bruits.   Extremities: mild edema, no clubbing, or cyanosis.   Skin: No rash, warm, appropriate for race.   Neuro: Alert and oriented x3. Grossly intact.  CN II-XII intact.  Normal mood and affect.     ECG:   Independent review shows:  NSR, L axis, low voltage    ASSESSMENT/PLAN:    Mobitz I AV block. Possible higher grade AV block. Will repeat event monitor after she has been on CPAP. Follow for symptomatic bradycardia.    HL. On statin.   HTN. BP elevated today. Cont to follow.     All patient's questions and concerns regarding cardiovascular disease were answered  during this visit.    No orders of the defined types were placed in this encounter.      No follow-ups on file.    Carolynne Edouard, MD  01/24/2023

## 2023-01-25 ENCOUNTER — Ambulatory Visit (INDEPENDENT_AMBULATORY_CARE_PROVIDER_SITE_OTHER): Payer: No Typology Code available for payment source | Admitting: Cardiovascular Disease

## 2023-01-25 ENCOUNTER — Encounter (INDEPENDENT_AMBULATORY_CARE_PROVIDER_SITE_OTHER): Payer: Self-pay | Admitting: Cardiovascular Disease

## 2023-01-25 VITALS — BP 104/72 | HR 86 | Wt 258.2 lb

## 2023-01-25 DIAGNOSIS — E7849 Other hyperlipidemia: Secondary | ICD-10-CM

## 2023-01-25 DIAGNOSIS — I441 Atrioventricular block, second degree: Secondary | ICD-10-CM

## 2023-01-25 LAB — ECG 12-LEAD
Atrial Rate: 77 {beats}/min
IHS MUSE NARRATIVE AND IMPRESSION: NORMAL
P Axis: 57 degrees
P-R Interval: 152 ms
Q-T Interval: 362 ms
QRS Duration: 72 ms
QTC Calculation (Bezet): 409 ms
R Axis: -27 degrees
T Axis: -8 degrees
Ventricular Rate: 77 {beats}/min

## 2023-02-10 ENCOUNTER — Other Ambulatory Visit: Payer: Self-pay | Admitting: Family Medicine

## 2023-02-10 DIAGNOSIS — E049 Nontoxic goiter, unspecified: Secondary | ICD-10-CM

## 2023-02-10 DIAGNOSIS — Z1231 Encounter for screening mammogram for malignant neoplasm of breast: Secondary | ICD-10-CM

## 2023-02-16 ENCOUNTER — Encounter (INDEPENDENT_AMBULATORY_CARE_PROVIDER_SITE_OTHER): Payer: Self-pay

## 2023-02-18 ENCOUNTER — Ambulatory Visit
Admission: RE | Admit: 2023-02-18 | Discharge: 2023-02-18 | Disposition: A | Discharge status: Home / Self Care | Payer: No Typology Code available for payment source | Source: Ambulatory Visit | Attending: Family Medicine | Admitting: Family Medicine

## 2023-02-18 DIAGNOSIS — E049 Nontoxic goiter, unspecified: Secondary | ICD-10-CM

## 2023-05-02 ENCOUNTER — Ambulatory Visit (INDEPENDENT_AMBULATORY_CARE_PROVIDER_SITE_OTHER): Payer: No Typology Code available for payment source | Admitting: Internal Medicine

## 2023-05-02 ENCOUNTER — Encounter (INDEPENDENT_AMBULATORY_CARE_PROVIDER_SITE_OTHER): Payer: Self-pay

## 2023-06-15 ENCOUNTER — Other Ambulatory Visit: Payer: Self-pay | Admitting: Family Medicine

## 2023-06-15 ENCOUNTER — Ambulatory Visit: Payer: No Typology Code available for payment source

## 2023-06-15 ENCOUNTER — Ambulatory Visit
Admission: RE | Admit: 2023-06-15 | Discharge: 2023-06-15 | Disposition: A | Discharge status: Home / Self Care | Payer: No Typology Code available for payment source | Source: Ambulatory Visit | Attending: Family Medicine | Admitting: Family Medicine

## 2023-06-15 DIAGNOSIS — E559 Vitamin D deficiency, unspecified: Secondary | ICD-10-CM | POA: Insufficient documentation

## 2023-06-15 DIAGNOSIS — R7303 Prediabetes: Secondary | ICD-10-CM

## 2023-06-15 DIAGNOSIS — M25561 Pain in right knee: Secondary | ICD-10-CM | POA: Insufficient documentation

## 2023-06-15 DIAGNOSIS — M25562 Pain in left knee: Secondary | ICD-10-CM

## 2023-06-15 DIAGNOSIS — D649 Anemia, unspecified: Secondary | ICD-10-CM | POA: Insufficient documentation

## 2023-06-15 LAB — COMPREHENSIVE METABOLIC PANEL
ALT: 6 U/L (ref 0–55)
AST (SGOT): 12 U/L (ref 5–41)
Albumin/Globulin Ratio: 1 (ref 0.9–2.2)
Albumin: 3.4 g/dL — ABNORMAL LOW (ref 3.5–5.0)
Alkaline Phosphatase: 64 U/L (ref 37–117)
Anion Gap: 8 (ref 5.0–15.0)
BUN: 12 mg/dL (ref 7–21)
Bilirubin, Total: 0.3 mg/dL (ref 0.2–1.2)
CO2: 27 meq/L (ref 17–29)
Calcium: 9.3 mg/dL (ref 8.5–10.5)
Chloride: 106 meq/L (ref 99–111)
Creatinine: 0.7 mg/dL (ref 0.4–1.0)
GFR: >60 mL/min/{1.73_m2} (ref >=60.0)
Globulin: 3.4 g/dL (ref 2.0–3.6)
Glucose: 87 mg/dL (ref 70–100)
Hemolysis Index: 5 {index}
Potassium: 4.4 meq/L (ref 3.5–5.3)
Protein, Total: 6.8 g/dL (ref 6.0–8.3)
Sodium: 141 meq/L (ref 135–145)

## 2023-06-15 LAB — LAB USE ONLY - CBC WITH DIFFERENTIAL
Absolute Basophils: 0.08 10*3/uL (ref 0.00–0.08)
Absolute Eosinophils: 0.45 10*3/uL — ABNORMAL HIGH (ref 0.00–0.44)
Absolute Immature Granulocytes: 0.01 10*3/uL (ref 0.00–0.07)
Absolute Lymphocytes: 2.49 10*3/uL (ref 0.42–3.22)
Absolute Monocytes: 0.45 10*3/uL (ref 0.21–0.85)
Absolute Neutrophils: 3.06 10*3/uL (ref 1.10–6.33)
Absolute nRBC: 0 10*3/uL (ref <=0.00)
Basophils %: 1.2 %
Eosinophils %: 6.9 %
Hematocrit: 34.3 % — ABNORMAL LOW (ref 34.7–43.7)
Hemoglobin: 10.2 g/dL — ABNORMAL LOW (ref 11.4–14.8)
Immature Granulocytes %: 0.2 %
Lymphocytes %: 38.1 %
MCH: 26.2 pg (ref 25.1–33.5)
MCHC: 29.7 g/dL — ABNORMAL LOW (ref 31.5–35.8)
MCV: 87.9 fL (ref 78.0–96.0)
MPV: 10.4 fL (ref 8.9–12.5)
Monocytes %: 6.9 %
Neutrophils %: 46.7 %
Platelet Count: 426 10*3/uL — ABNORMAL HIGH (ref 142–346)
Preliminary Absolute Neutrophil Count: 3.06 10*3/uL (ref 1.10–6.33)
RBC: 3.9 10*6/uL (ref 3.90–5.10)
RDW: 15 % (ref 11–15)
WBC: 6.54 10*3/uL (ref 3.10–9.50)
nRBC %: 0 /100{WBCs} (ref <=0.0)

## 2023-06-15 LAB — IRON PROFILE
Iron Saturation: 15 % (ref 15–50)
Iron: 50 ug/dL (ref 32–157)
TIBC: 339 ug/dL (ref 265–497)
UIBC: 289 ug/dL (ref 126–382)

## 2023-06-15 LAB — HEMOGLOBIN A1C
Average Estimated Glucose: 119.8 mg/dL
Hemoglobin A1C: 5.8 % — ABNORMAL HIGH (ref 4.6–5.6)

## 2023-06-18 LAB — VITAMIN D 1,25-DIHYDROXY
Vitamin D, 1,25-Dihydroxy, Total: 38 pg/mL (ref 18–72)
Vitamin D2, 1,25-Dihydroxy: <8 pg/mL
Vitamin D3 1,25-Dihydroxy (Calcitriol): 38 pg/mL

## 2023-07-12 ENCOUNTER — Encounter (INDEPENDENT_AMBULATORY_CARE_PROVIDER_SITE_OTHER): Payer: Self-pay | Admitting: Specialist

## 2023-07-12 ENCOUNTER — Encounter (INDEPENDENT_AMBULATORY_CARE_PROVIDER_SITE_OTHER): Payer: No Typology Code available for payment source | Admitting: Specialist

## 2023-07-12 NOTE — Progress Notes (Signed)
error 

## 2023-07-19 ENCOUNTER — Ambulatory Visit (INDEPENDENT_AMBULATORY_CARE_PROVIDER_SITE_OTHER): Payer: No Typology Code available for payment source | Admitting: Specialist

## 2023-07-20 NOTE — Progress Notes (Unsigned)
[facility] CONSULTATION      No chief complaint on file.      I had the pleasure of seeing Ms. Matura today for cardiovascular evaluation. She is a pleasant 62 y.o. female with a history of  Mobitz type 1 block, Obesity, tobacco abuse, HLP  She was seen by Dr Onalee Hua [place] for h/o mobitz I AV block - unclear if this was on an EKG or telemetry. She has vertigo and takes meclizine  She had cardiac monitor for 7 days on 09/03/2021. Mobitz I AV block with evidence of higher grade AV block with a pause of 4.7 seconds during sleeping hour.    She gets dizzy sometimes while standing too fast. No syncope.   She snores at night time but has not had sleep study.   She denies any chest pain, shortness of breath. She does daily activities but no exercise program. She goes outside and goes to [employer].   She is taking lasix for leg swelling since 2016.   She does not take tizanidine  She is on disability.     Echo 10/2021   Left ventricular systolic function is normal with an ejection fraction by  Biplane Method of Discs of  55 %.    * There is mild concentric left ventricular hypertrophy.    * No significant valvular dysfunction.     Echo: 05/02/2017  1. Normal left ventricular size and systolic function,   estimated EF 55 to 60%               2. Mild mid anteroseptal hypokinesis                3. Normal diastolic function                4. No significant valvular abnormality                5. Aneurysmal atrial septum with no evidence of shunt.    [place] 03/08/2018   No evidence of abdominal aortic aneurysm with a normal  caliber widely patent aorta noted.     PAST MEDICAL HISTORY:   Past Medical History:   Diagnosis Date    Arthritis     Claustrophobia     Depression     Difficulty walking     walks with cane    Dizziness     when laying flat    Multiple thyroid nodules     noted 2013 admission    Neuromyopathy     lt toes numb    Seasonal allergic rhinitis     Shortness of breath     Wears glasses     for reading          MEDICATIONS:     Current Outpatient Medications   Medication Sig Dispense Refill    albuterol sulfate HFA (PROVENTIL) 108 (90 Base) MCG/ACT inhaler Inhale 2 puffs into the lungs every 4 (four) hours as needed      atorvastatin (LIPITOR) 40 MG tablet Take 1 tablet (40 mg) by mouth daily 90 tablet 3    clotrimazole (LOTRIMIN) 1 % cream Apply topically as needed      diclofenac sodium (VOLTAREN) 1 % Gel topical gel Apply topically 4 (four) times daily      escitalopram (LEXAPRO) 10 MG tablet Take 1 tablet (10 mg) by mouth daily      furosemide (LASIX) 40 MG tablet Take 1 tablet (40 mg total) by mouth daily. 30  tablet 3    lidocaine (LIDODERM) 5 % Place 1 patch onto the skin as needed      meclizine (ANTIVERT) 25 MG tablet Take 1 tablet (25 mg) by mouth every 8 (eight) hours as needed      naproxen (NAPROSYN) 500 MG tablet Take 1 tablet (500 mg) by mouth 2 (two) times daily with meals      oxyCODONE-acetaminophen (PERCOCET) 5-325 MG per tablet TAKE 1/2 TO 1 FULL TABLET BY MOUTH EVERY 12 HOURS AS NEEDED FOR PAIN WITH FOOD AND NONALCOHOLIC BEVERAGES      traZODone (DESYREL) 50 MG tablet as needed          No current facility-administered medications for this visit.           ALLERGIES:   Allergies   Allergen Reactions    Tramadol Rash    Other Animal      Cats and Dogs    Pollen Extract     Tomato Rash         FAMILY HISTORY: Her family history includes Heart disease in her brother, father, mother, sister, and son; Kidney disease in her father and son.  No premature CAD.      SOCIAL HISTORY: She reports that she has been smoking cigarettes. She has a 20 pack-year smoking history. She has been exposed to tobacco smoke. She has never used smokeless tobacco. She reports that she does not drink alcohol and does not use drugs.  Smokes 5 cigarettes/day from age 39 yrs. No alcohol, recreational drugs.     REVIEW OF SYSTEMS:   No fever, chills, nausea, vomiting.   All other systems reviewed and negative except as above.          PHYSICAL EXAMINATION  Vital Signs: There were no vitals taken for this visit.   Vital signs reviewed    Wt Readings from Last 3 Encounters:   07/12/23 119.7 kg (263 lb 12.8 oz)   01/25/23 117.1 kg (258 lb 3.2 oz)   11/02/22 113.4 kg (250 lb)        General Appearance:  A well-appearing female in no acute distress.  Obesity.   HEENT: Sclera anicteric, conjunctiva without pallor, moist mucous membranes, normal dentition.   Neck:  Supple. No jugular venous distention.  No carotid bruits.   Chest: Clear to auscultation bilaterally with good air movement and respiratory effort. No wheezes, rales.    Cardiac: Regular S1, S2.  Normal S1 and S2, without gallops or rub. No murmurs.    Vascular:  Radial pulses palpable bilaterally  Abdomen: Soft, nontender, nondistended, with normoactive bowel sounds.  No bruits.   Extremities: Warm. Trace pedal edema. No clubbing, or cyanosis.   Skin: No rash, warm.    Neuro: Alert and oriented x3. Normal strength in four extremities.  Normal mood and affect.       ECG:  Date:   Independent review shows:   Latest Reference Range & Units 01/25/20 10:28   TSH 0.35 - 4.94 uIU/mL 1.33       ASSESSMENT/PLAN:    Mobitz type 1 block and  4.7 seconds pause at 6.40 am but no syncope. She has mobitz I block.  I reviewed cardiac monitor tracing.  She appears to have non conducted P wave Mobit type 2 AV versus artifact. I discussed with electrophysiologist Dr Maurine Cane. Will get loop recorder to better assess heart block and correlate with symptoms.  No symptoms of syncope.  She has snoring/morbid obesity: likely has high  vagal tone causing sinus pauses and Mobitz type I block. I discussed about exercise stress test to evaluate for chronotropic response/heart block assessment. Echo to evaluate for EF.  No history of tick bite.  I discussed about doing a cardiac MRI for possible evaluation of sarcoidosis.  She states that she cannot tolerate MRI.  She was instructed to seek medical attention if  she has any syncope.  dyslipidemia: on atorvastatin 20 mg qd.  OSA,   Morbid obesity: BMI: 47.19 kg/m2: lifestyle modification discussed with patient.         All patient's questions and concerns regarding cardiovascular disease were answered during this visit.  Total time spent 45 minutes evaluating patient discussion of management plan discussion with electrophysiologist and calling the patient later on after the office visit to update the plan of care.    Patient instruction:      Loop recorder  Blood test  F/u with sleep medicine.  Increase physical activity as tolerated.    No orders of the defined types were placed in this encounter.        No follow-ups on file.    Ellender Hose, MD  07/20/2023

## 2023-07-21 ENCOUNTER — Ambulatory Visit (INDEPENDENT_AMBULATORY_CARE_PROVIDER_SITE_OTHER): Payer: No Typology Code available for payment source | Admitting: Internal Medicine

## 2023-07-21 ENCOUNTER — Encounter (INDEPENDENT_AMBULATORY_CARE_PROVIDER_SITE_OTHER): Payer: Self-pay | Admitting: Internal Medicine

## 2023-07-21 VITALS — BP 133/82 | HR 85 | Temp 97.9°F | Ht 62.0 in | Wt 265.0 lb

## 2023-07-21 DIAGNOSIS — R0683 Snoring: Secondary | ICD-10-CM

## 2023-07-21 DIAGNOSIS — R0602 Shortness of breath: Secondary | ICD-10-CM

## 2023-07-21 DIAGNOSIS — I441 Atrioventricular block, second degree: Secondary | ICD-10-CM

## 2023-07-21 LAB — ECG 12-LEAD
Atrial Rate: 79 {beats}/min
IHS MUSE NARRATIVE AND IMPRESSION: NORMAL
P Axis: 56 degrees
P-R Interval: 150 ms
Q-T Interval: 346 ms
QRS Duration: 76 ms
QTC Calculation (Bezet): 396 ms
R Axis: -40 degrees
T Axis: 4 degrees
Ventricular Rate: 79 {beats}/min

## 2023-07-21 NOTE — Patient Instructions (Addendum)
STOP smoking.  Echo  NT pro BNP  Pulmonologist follow up.  Blood test  F/u with sleep medicine for CPAP therapy management   Increase physical activity as tolerated.  Bariatric medicine follow up  Do not start phentermine.  Low salt diet  Compression stocking in legs.    Follow up with Dr Carolynne Edouard next available.

## 2023-08-04 ENCOUNTER — Other Ambulatory Visit (INDEPENDENT_AMBULATORY_CARE_PROVIDER_SITE_OTHER): Payer: Self-pay | Admitting: Internal Medicine

## 2023-08-05 ENCOUNTER — Encounter (INDEPENDENT_AMBULATORY_CARE_PROVIDER_SITE_OTHER): Payer: Self-pay

## 2023-08-05 LAB — NT-PROBNP: proBNP: 126 pg/mL (ref 0–287)

## 2023-08-12 ENCOUNTER — Ambulatory Visit: Payer: No Typology Code available for payment source

## 2023-08-17 ENCOUNTER — Ambulatory Visit
Admission: RE | Admit: 2023-08-17 | Discharge: 2023-08-17 | Disposition: A | Discharge status: Home / Self Care | Payer: No Typology Code available for payment source | Source: Ambulatory Visit | Attending: Family Medicine | Admitting: Family Medicine

## 2023-08-17 ENCOUNTER — Other Ambulatory Visit: Payer: Self-pay | Admitting: Family Medicine

## 2023-08-17 DIAGNOSIS — Z1231 Encounter for screening mammogram for malignant neoplasm of breast: Secondary | ICD-10-CM | POA: Insufficient documentation

## 2023-08-18 ENCOUNTER — Ambulatory Visit: Payer: No Typology Code available for payment source

## 2023-10-04 ENCOUNTER — Ambulatory Visit (INDEPENDENT_AMBULATORY_CARE_PROVIDER_SITE_OTHER): Payer: No Typology Code available for payment source | Admitting: Specialist

## 2023-10-21 ENCOUNTER — Ambulatory Visit (INDEPENDENT_AMBULATORY_CARE_PROVIDER_SITE_OTHER): Payer: No Typology Code available for payment source | Admitting: Cardiovascular Disease

## 2023-10-28 ENCOUNTER — Encounter (INDEPENDENT_AMBULATORY_CARE_PROVIDER_SITE_OTHER): Payer: No Typology Code available for payment source

## 2023-11-10 ENCOUNTER — Encounter (INDEPENDENT_AMBULATORY_CARE_PROVIDER_SITE_OTHER): Payer: No Typology Code available for payment source

## 2023-11-15 NOTE — Progress Notes (Deleted)
 [place] PULMONARY CONSULT    Patient Name: Kristina [name],Kristina [name]    Date of Visit:  11/16/2023  Date of Birth: 23-Feb-1961  AGE: 63 y.o.  Medical Record #: [id number]  Requesting Physician: Rolan KATHEE Mau, MD    HISTORY OF PRESENT ILLNESS:  Initial history 11/16/23  Kristina [name]  is [name] 63 y.o.  female with [name] history of exertional dyspnea, dry cough, allergic rhinitis, OSA, current smoker, second degree atrioventricular block Mobitz type I, HLD, sinus pause, and multiple thyroid  nodules who presents for evaluation and management of her exertional dyspnea and cough.    She was referred by her cardiologist, Dr. Fleta Priest on 07/21/23. His note was reviewed and per his note, the patient has [name] history of intermittent dizziness/vertigo, exertional dyspnea, and [name] chronic cough. She continues to smoke 5 cigarettes/day. She has chronic lower extremity edema managed with furosemide . She experiences dizziness about twice [name] week, often with standing too quickly, but no syncope. She snores at night but has not had [name] sleep study.  She had shortness of breath three weeks ago due to COVID-19, which has since improved, and she can now walk for 15 minutes without stopping. She remains active with daily activities but does not follow [name] structured exercise program.    ***        IMAGING:  The following imaging was personally reviewed:    CXR AP and LAT views 07/04/23 at [facility] demonstrates:  The heart size and mediastinal contours are within normal limits.   Both lungs are clear. The visualized skeletal structures are   unremarkable.       Patient Care Team:  Mau Rolan KATHEE, MD as PCP - General (Family Medicine)  Renay Zachary DELENA, MD as Consulting Physician (Cardiology)  Verline Jodee SAILOR, MD as Consulting Physician (Cardiology)  Silvio Shivers, MD as Consulting Physician (Cardiology)  [place], [place] , [place]  Viviana Lenis, MD as Consulting Physician (Cardiology)  Priest Fleta, MD as Consulting Physician (Advanced Heart  Failure and Transplant Cardiology)  Mychart, Generic Provider (Inactive)  Jess Piggs, MD as Consulting Physician (Cardiology)      MEDICATIONS:    Current Outpatient Medications:     albuterol  sulfate HFA (PROVENTIL ) 108 (90 Base) MCG/ACT inhaler, Inhale 2 puffs into the lungs every 4 (four) hours as needed, Disp: , Rfl:     atorvastatin  (LIPITOR) 40 MG tablet, Take 1 tablet (40 mg) by mouth daily, Disp: 90 tablet, Rfl: 3    clotrimazole (LOTRIMIN) 1 % cream, Apply topically as needed, Disp: , Rfl:     diclofenac sodium (VOLTAREN) 1 % Gel topical gel, Apply topically 4 (four) times daily, Disp: , Rfl:     escitalopram (LEXAPRO) 10 MG tablet, Take 1 tablet (10 mg) by mouth daily, Disp: , Rfl:     furosemide  (LASIX ) 40 MG tablet, Take 1 tablet (40 mg total) by mouth daily., Disp: 30 tablet, Rfl: 3    lidocaine  (LIDODERM ) 5 %, Place 1 patch onto the skin as needed, Disp: , Rfl:     meclizine (ANTIVERT) 25 MG tablet, Take 1 tablet (25 mg) by mouth every 8 (eight) hours as needed, Disp: , Rfl:     naproxen  (NAPROSYN ) 500 MG tablet, Take 1 tablet (500 mg) by mouth 2 (two) times daily with meals, Disp: , Rfl:     oxyCODONE -acetaminophen  (PERCOCET) 5-325 MG per tablet, TAKE 1/2 TO 1 FULL TABLET BY MOUTH EVERY 12 HOURS AS NEEDED FOR PAIN WITH FOOD AND NONALCOHOLIC BEVERAGES, Disp: ,  Rfl:     traZODone (DESYREL) 50 MG tablet, as needed  , Disp: , Rfl:       There were no vitals filed for this visit.     Physical Exam    PULMONARY DIAGNOSTICS:    Spirometry on 11/16/2023: FVC: *** (***%), FEV1: *** (***%), FEV1/FVC: ***%--      LABS:        ASSESSMENT & PLAN  Ms. Doffing is [name] 63 y.o. female with the following problems:    Dyspnea on exertion  ***    Cough   ***    Allergic rhinitis  ***    OSA  ***    Current smoker  ***        ------------------------------------------------------------------------------------------------------------   No orders of the defined types were placed in this encounter.      [name] total of *** minutes  was spent on this visit reviewing previous notes, counseling the patient and/or family members regarding the patient's condition(s), ordering of tests, managing medications, and documenting the findings in the note.     I personally scribed for Jorie Cha, MD on 11/16/2023. The documentation recorded by the scribe, Josslyn Ciolek, accurately reflects the service I personally performed and the decisions made by me, Jorie Cha, MD.    SIGNED:    Jorie Cha, MD  [facility] Pulmonology

## 2023-11-16 ENCOUNTER — Ambulatory Visit: Payer: No Typology Code available for payment source | Admitting: Internal Medicine

## 2023-11-16 DIAGNOSIS — J309 Allergic rhinitis, unspecified: Secondary | ICD-10-CM

## 2023-11-16 DIAGNOSIS — R053 Chronic cough: Secondary | ICD-10-CM

## 2023-11-16 DIAGNOSIS — F17210 Nicotine dependence, cigarettes, uncomplicated: Secondary | ICD-10-CM

## 2023-11-16 DIAGNOSIS — R0609 Other forms of dyspnea: Secondary | ICD-10-CM

## 2023-11-29 ENCOUNTER — Encounter (INDEPENDENT_AMBULATORY_CARE_PROVIDER_SITE_OTHER): Payer: No Typology Code available for payment source

## 2023-12-21 ENCOUNTER — Encounter (INDEPENDENT_AMBULATORY_CARE_PROVIDER_SITE_OTHER): Payer: No Typology Code available for payment source

## 2023-12-28 ENCOUNTER — Ambulatory Visit: Payer: No Typology Code available for payment source | Attending: Neurology | Admitting: Neurology

## 2023-12-28 ENCOUNTER — Encounter: Payer: Self-pay | Admitting: Neurology

## 2023-12-28 VITALS — BP 161/77 | HR 88 | Temp 98.8°F | Resp 18 | Ht 62.0 in | Wt 236.0 lb

## 2023-12-28 DIAGNOSIS — I440 Atrioventricular block, first degree: Secondary | ICD-10-CM | POA: Insufficient documentation

## 2023-12-28 DIAGNOSIS — I10 Essential (primary) hypertension: Secondary | ICD-10-CM | POA: Insufficient documentation

## 2023-12-28 DIAGNOSIS — E669 Obesity, unspecified: Secondary | ICD-10-CM | POA: Insufficient documentation

## 2023-12-28 DIAGNOSIS — G4733 Obstructive sleep apnea (adult) (pediatric): Secondary | ICD-10-CM | POA: Insufficient documentation

## 2023-12-28 DIAGNOSIS — R0683 Snoring: Secondary | ICD-10-CM | POA: Insufficient documentation

## 2023-12-28 NOTE — Progress Notes (Signed)
 Neurology Progress Note    Patient Name:  Kristina [name],Kristina [name]  Date:  12/28/2023     HPI:    She had been complaining of feeling tired during the day. She dozes off at TV and when she is inactive. She has loud snoring. She had [name] PSG on 12/24/2022 showing moderate OSA with 19 events per hour and severe desaturations with minimum O2 of 73%.    She goes to bed at 10:30 pm and falls asleep in < 10 minutes. She wakes up once during the night. She does not move around [name] lot at night. She gets up at 6 am and feels good. She has 1 cup of coffee.    She has daytime hypersomnia in the afternoon. The patient complains of low energy levels. There is reduced concentration and attention when inactive. The patient dozes off during the day if alone or inactive. She has good short term memory. The situation is worse if there is not enough sleep.          Notes from Dr. Winfield on 11/02/2022:    Telemedicine visit with video initiated. This information was discussed with the patient, who gave verbal consent to proceed with [name] telemedicine appointment.  This visit had to be started on [email or web address] because patient had technical difficulties connecting on epic. Then we after 10 mins on doxy we had technical difficulties so it had to be finsihed with [name] phone call     Chief Complaint   Mobitz I AV block. Referred from cardiology with Concern for sleep apnea.     History of Present Illness   She has been seeing cardiology for Mobitz I AV block and prolonged holter showed up to 4.7 second pauses at night while sleeping. Cardiology is concerned for possible higher grade block. Here is [name] copy of Dr. Loran (cardiology) 07/2022 note Possible higher grade AV block. She was referred for [name] sleep study as her pause occurred during sleeping hours but she did not follow through. Follow for symptomatic bradycardia.        They endorse the following symptoms:  [x]  Habitual snoring  []  Apneas witnessed by bed partner  []  Awakens feeling unrefreshed by  sleep  []  Excessive daytime sleepiness  []  Excessive daytime fatigue  []  Restlessness during sleep  []  Sudden awakenings with [name] sensation of gasping or choking  []  Dry mouth or sore throat upon awakening  []  Intellectual impairment, such as trouble concentrating, forgetfulness, or irritability  []  Excessive nighttime urinary frequency  []  Night sweats  []  Morning headaches on waking     She used to have trazodone for insomnia after her son and mother passed away but she rarely uses it not.  Subjectively, she thinks she sleeps peacefully.  She has been snoring for [name] long time.   She denies trouble falling or staying asleep.  She goes to bed at 8p. Watching [name] movie.  Falls asleep 10p.   Wakes at 3a to use bathroom.  Final wake time 7a.  She feels reasonably refreshed on waking.     She will doze during the day if just sitting watching TV.        EPWORTH SLEEPINESS SCALE     How Likely Are You to Fall Asleep     Sitting and reading?  2 - Moderate chance of dozing or sleeping  Watching TV?                                                                         2 - Moderate chance of dozing or sleeping  Sitting inactive in [name] public place?                                           0 - Never doze or sleep  As [name] passenger in [name] car for an hour or more?                       0 - Never doze or sleep  Lying down to rest in the afternoon?                                      3 - High chance of dozing or sleeping  Sitting and talking to someone?                                             0 - Never doze or sleep  Sitting quietly after lunch without alcohol?                             2 - Moderate chance of dozing or sleeping  Stopped for [name] few minutes at [name] traffic light?                           0 - Never doze or sleep     Total Score                                                                            9              Score 0-9 = Not sleepy              Score  10-17  = Moderate sleepy              Score 18+  = Very sleepy     STOP-BANG QUESTIONNAIRE     Do you snore loudly (louder than talking                                            YES  or loud enough to be heard through closed doors)?      Do you often feel tired,  fatigued, or sleepy during daytime?             YES     Has anyone observed you stop breathing during your sleep?                       No     Do you have or are you being treated for high blood pressure?       YES     Is your BMI more than 35 kg/m2?                                                      YES     Are you over 17 years old?                                                                 YES     Is your neck circumference greater than 40cm?                                YES     Are you female?                                                                                                  No     High risk of OSA: answering yes to three or more items  Low risk of OSA: answering yes to less than three items        Ancillary Data:  Her previous sleep studies and medical records were reviewed with the patient.      PRIOR SLEEP TESTING:      no        Medications:    Current Outpatient Medications   Medication Instructions    albuterol  sulfate HFA (PROVENTIL ) 108 (90 Base) MCG/ACT inhaler 2 puffs, Every 4 hours PRN    atorvastatin  (LIPITOR) 40 mg, Oral, Daily    clotrimazole (LOTRIMIN) 1 % cream As needed    diclofenac sodium (VOLTAREN) 1 % Gel topical gel 4 times daily    escitalopram (LEXAPRO) 10 mg, Daily    furosemide  (LASIX ) 40 mg, Oral, Daily    lidocaine  (LIDODERM ) 5 % 1 patch, As needed    meclizine (ANTIVERT) 25 mg, Every 8 hours PRN    metFORMIN (GLUCOPHAGE) 500 MG tablet TAKE ONE TABLET BY MOUTH EVERY DAY WITH MEALS    naproxen  (NAPROSYN ) 500 mg, 2 times daily with meals    oxyCODONE -acetaminophen  (PERCOCET) 5-325 MG per tablet TAKE 1/2 TO 1 FULL TABLET BY MOUTH EVERY 12 HOURS AS NEEDED FOR PAIN WITH FOOD AND NONALCOHOLIC BEVERAGES     traZODone (DESYREL)  50 MG tablet As needed       The following portions of the patient's history were reviewed and updated as appropriate: allergies, current medications, past family history, past medical history, past social history, past surgical history and problem list.      Review of Systems:    The patient denies loss of consciousness or syncope. There is no chest pain or shortness of breath. All systems were reviewed and were negative except as described in the HPI.    Active Ambulatory Problems     Diagnosis Date Noted    Multiple thyroid  nodules 08/10/2012    Second degree atrioventricular block, Mobitz (type) I 01/18/2014    Allergic rhinitis 01/18/2014    Hyperlipidemia 01/19/2014    Morbid obesity (CMS/HCC) 01/19/2014    Bilateral lower extremity edema 01/19/2014    Cigarette smoker one half pack [name] day or less 04/11/2017    Screening for colon cancer 07/14/2021    Sinus pause 09/24/2021     Resolved Ambulatory Problems     Diagnosis Date Noted    Tingling in extremities 08/10/2012    Chest pain 01/18/2014    Dyspnea on exertion 01/18/2014    Muscle weakness 12/12/2017     Past Medical History:   Diagnosis Date    Arthritis     Claustrophobia     Depression     Difficulty walking     Dizziness     Neuromyopathy (CMS/HCC)     Seasonal allergic rhinitis     Shortness of breath     Wears glasses      Problem List[1]        Exam       BP 161/77 (BP Site: Right arm, Patient Position: Sitting, Cuff Size: Large)   Pulse 88   Temp 98.8 F (37.1 C) (Oral)   Resp 18   Ht 1.575 m (5' 2)   Wt 107 kg (236 lb)   SpO2 96%   BMI 43.16 kg/m     The patient is well developed and well nourished, sitting comfortably in the exam room in no acute distress. Patient is awake and alert. Speech is normal without aphasia. Cognitive function is intact, oriented times three. Recent and remote memory are intact.    Cranial nerves reveal that pupils are equally reactive to light. Extraocular muscles are intact with conjugate  movements. There is no ptosis. The face is symmetric.     Motor exam is intact with equal strength bilaterally. Muscle tone and bulk are normal. There are no fasciculations and no atrophy.     Coordination is intact. There is no dysmetria. Gait is intact with no ataxia.         Assessment:    1)  Moderate OSA with severe O2 desaturations, in need of treatment. This is [name] chronic condition which negatively impacts quality of life and also increases risk of comorbidities including HTN, DM, possible cardiac arrhythmias, stroke and MI. There is increased risk of motor vehicle accidents and possible injury to self and others.  2)  Obesity  3)  First degree AV block  4)  Depression  5)  HLD  6)  Chronic pain with daily narcotic use      Plan:    1)  CPAP titration study  2)  She will get the phone app for her new CPAP equipment  3)  Follow up with me 2 months after she gets her equipment    Follow up in 4 months  Time spent 40 minutes      Damek Ende B. Wyman, MD  [facility] Neurology  Board certified in Neurology,   Clinical Neurophysiology and Sleep Medicine  [phone] [facility] office               [1]   Patient Active Problem List  Diagnosis    Multiple thyroid  nodules    Second degree atrioventricular block, Mobitz (type) I    Allergic rhinitis    Hyperlipidemia    Morbid obesity (CMS/HCC)    Bilateral lower extremity edema    Cigarette smoker one half pack [name] day or less    Screening for colon cancer    Sinus pause

## 2023-12-28 NOTE — Patient Instructions (Signed)
 1)  Get the CPAP test done  2)  We will call you with how to get your machine  3)  Get the phone app for following how you are doing  "[employer]"  4)  Then, follow up with me two months after getting your CPAP machine

## 2024-01-18 ENCOUNTER — Ambulatory Visit (INDEPENDENT_AMBULATORY_CARE_PROVIDER_SITE_OTHER)

## 2024-01-18 DIAGNOSIS — I441 Atrioventricular block, second degree: Secondary | ICD-10-CM

## 2024-01-18 DIAGNOSIS — R0602 Shortness of breath: Secondary | ICD-10-CM

## 2024-01-19 ENCOUNTER — Encounter (INDEPENDENT_AMBULATORY_CARE_PROVIDER_SITE_OTHER): Payer: Self-pay | Admitting: Internal Medicine

## 2024-01-19 LAB — ECHO ADULT TTE COMPLETE
AV Area (Cont Eq VTI): 2.2833
AV Mean Gradient: 3
AV Peak Velocity: 1.15
Ao Root Diameter (2D): 2.6
BP Mod LV Ejection Fraction: 65
IVS Diastolic Thickness (2D): 1
LA Dimension (2D): 4.2
LA Volume Index (BP A-L): 19.398
LVID diastole (2D): 4.1
LVID systole (2D): 2.5
MV E/A: 0.8443
MV E/e' (Average): 10.8988
Mitral Valve Findings: NORMAL
Prox Ascending Aorta Diameter: 2.8
RV Basal Diastolic Dimension: 3.5
TAPSE: 2.11
Tricuspid Valve Findings: NORMAL

## 2024-02-15 ENCOUNTER — Encounter (INDEPENDENT_AMBULATORY_CARE_PROVIDER_SITE_OTHER): Payer: Self-pay | Admitting: Cardiovascular Disease

## 2024-02-21 ENCOUNTER — Ambulatory Visit: Attending: Neurology

## 2024-02-21 DIAGNOSIS — G4733 Obstructive sleep apnea (adult) (pediatric): Secondary | ICD-10-CM | POA: Insufficient documentation

## 2024-02-21 DIAGNOSIS — E669 Obesity, unspecified: Secondary | ICD-10-CM | POA: Insufficient documentation

## 2024-02-21 DIAGNOSIS — I10 Essential (primary) hypertension: Secondary | ICD-10-CM | POA: Insufficient documentation

## 2024-02-21 DIAGNOSIS — I440 Atrioventricular block, first degree: Secondary | ICD-10-CM | POA: Insufficient documentation

## 2024-02-21 DIAGNOSIS — R0683 Snoring: Secondary | ICD-10-CM | POA: Insufficient documentation

## 2024-02-22 NOTE — Progress Notes (Signed)
 Patient: Kristina [name]     MRN#: [id number]     Date: 02/22/2024        Time: 6:04 AM    Kristina [name] is a 63 y.o. female scheduled for cpap study. Referring physician's order reviewed. Patient was oriented to the sleep lab environment and emergency fire exit route. Individualized sleep education provided to the patient. All questions answered. Plan of care for the entire patient stay was explained and patient was oriented and encouraged to use call bell and ask questions at any point during the study.      Patient displayed moderate respiratory events, worse when  (lateral, supine, prone).  Patient slept supine (body positions). Snoring was mild, and n1, n2, n3 and rem stages of sleep were reached.  Periodic leg movement syndrome (PLMS)/ Restless leg syndrome were: (Yes or No), yes  Minimum oxygen saturation (= Oxygen -NADIR): how much ?62%  EKG was nsr (normal/abnormal: if abnormal, state abnormality).      Patient is able to drive safely after being discharged from the sleep lab: (Yes or No)    Did the patient received a TITRATION STUDY: yes    Which titration mode at the end of the study: CPAP or BiPAP ?, bipap  Respiratory events were attenuated at 26/22cmH2O.  Snoring was attenuated at 26/22cmH2O.  Could patient sleep and got REM sleep on supine?  Yes or No, yes  How much the O2-NADIR at the end of the test:  %?95%  Oxygen supplement was applied?Aaron Aas How much was the O2 liter/ hour?, no    Which mask was fitted ( nasal/ full face/ nasal pillow) of RESMED or Respironics, full, resmed, Airfit F20  Size of the mask (Petit/ Small/ Medium/ Large), medium  Cold or warm humidifier was applied ??, warm  C-Flex: Yes or No? When Yes, how much is the level?, no

## 2024-02-27 ENCOUNTER — Ambulatory Visit (INDEPENDENT_AMBULATORY_CARE_PROVIDER_SITE_OTHER): Payer: No Typology Code available for payment source | Admitting: Pulmonary Disease

## 2024-02-27 ENCOUNTER — Telehealth: Payer: Self-pay

## 2024-02-27 NOTE — Addendum Note (Signed)
 Addended by: WYMAN KERNS B on: 02/27/2024 04:00 PM     Modules accepted: Orders

## 2024-02-27 NOTE — Telephone Encounter (Signed)
 Copied from CRM [id number]. Topic: Appointment Scheduling - Schedule Appointment  >> Feb 27, 2024 12:59 PM Laneta DEL wrote:  [facility], Kristina [name] called about Appointment Scheduling - Schedule Appointment.  Additional details:  Patient called to report her np appointment was mixed up. She went to [facility] thinking she had an appointment there with Dr. Theta but it was with Dr. Srinivas at [facility] 9am. Please advise when we are able to see patient next at [facility]. Patient declined next available. Please advise [phone]

## 2024-02-27 NOTE — Progress Notes (Signed)
 [facility] OFFICE VISIT      Chief Complaint   Patient presents with    Second degree atrioventricular block, Mobitz (type) I     6 month f/u       I had the pleasure of seeing Kristina [name] today for cardiovascular follow up. She is a pleasant 63 y.o. female with a history of obesity, tobacco use, HL, AV block, OSA, who presents for continued management.      She has a h/o mobitz I AV block that was found incidentally with pauses while sleeping. She has intermittent dizziness vs vertigo and takes meclizine PRN - no recent need for meclizine and symptoms seem to be improved. No h/o syncope.    She has chronic LE edema and takes lasix  40mg  daily. She states her edema has worsened with associated weight gain. No CP with daily activities. She denies worsening SOB with exertion - ambulates with a cane. She has chronic numbness on her L side after a L knee cortisone injection. She has intermittent dizziness which improves with meclizine. No presyncope.     She continues to smoke about 5 cigs/day.     Event monitor in 08/2021 showed evidence of higher grade AV block with pauses up to 4.7 seconds during sleeping hours.    Echo 01/19/24 showed EF of 65%, no sig valvular disease    Echo 10/21/21 showed EF of 55%, mild LVH, no sig valvular disease    Echo 05/02/17 showed EF of 55-60%, no sig valvular disease, ASA without shunt      MEDICATIONS:     Current Outpatient Medications   Medication Sig Dispense Refill    albuterol sulfate HFA (PROVENTIL) 108 (90 Base) MCG/ACT inhaler Inhale 2 puffs into the lungs every 4 (four) hours as needed      atorvastatin  (LIPITOR) 40 MG tablet Take 1 tablet (40 mg) by mouth daily (Patient taking differently: Take 0.5 tablets (20 mg) by mouth once daily) 90 tablet 3    Cholecalciferol (Vitamin D ) 50 MCG (2000 UT) Cap TAKE 1 CAPSULE EVERY WEEK BY MOUTH FOR 84 DAYS      escitalopram (LEXAPRO) 10 MG tablet Take 1 tablet (10 mg) by mouth daily      ezetimibe (ZETIA) 10 MG tablet Take 1 tablet  (10 mg) by mouth once daily      furosemide  (LASIX ) 40 MG tablet Take 1 tablet (40 mg total) by mouth daily. 30 tablet 3    iron-vitamin C (Vitron-C) 65-125 MG Tablet Take 1 tablet every day by oral route for 90 days.      meclizine (ANTIVERT) 25 MG tablet Take 1 tablet (25 mg) by mouth every 8 (eight) hours as needed      metFORMIN (GLUCOPHAGE) 500 MG tablet TAKE ONE TABLET BY MOUTH EVERY DAY WITH MEALS      naproxen  (NAPROSYN ) 500 MG tablet Take 1 tablet (500 mg) by mouth 2 (two) times daily with meals      traZODone (DESYREL) 50 MG tablet as needed         triamcinolone  (KENALOG) 0.1 % cream APPLY CREAM EXTERNALLY TO AFFECTED AREA TWICE DAILY AS NEEDED 90 DAYS      clotrimazole (LOTRIMIN) 1 % cream Apply topically as needed      diclofenac sodium (VOLTAREN) 1 % Gel topical gel Apply topically 4 (four) times daily      lidocaine  (LIDODERM ) 5 % Place 1 patch onto the skin as needed      oxyCODONE -acetaminophen  (  PERCOCET) 5-325 MG per tablet TAKE 1/2 TO 1 FULL TABLET BY MOUTH EVERY 12 HOURS AS NEEDED FOR PAIN WITH FOOD AND NONALCOHOLIC BEVERAGES (Patient not taking: Reported on 02/29/2024)      spironolactone (ALDACTONE) 25 MG tablet Take 0.5 tablets (12.5 mg) by mouth once daily 45 tablet 3     No current facility-administered medications for this visit.       REVIEW OF SYSTEMS: All other systems reviewed and negative except as above.    PHYSICAL EXAMINATION  Vital Signs: BP 115/75 (BP Site: Left arm, Patient Position: Sitting, Cuff Size: Medium)   Pulse 73   Ht 1.575 m (5' 2)   Wt 132 kg (291 lb)   SpO2 96%   BMI 53.22 kg/m    Vital signs reviewed    Wt Readings from Last 3 Encounters:   02/29/24 132 kg (291 lb)   12/28/23 107 kg (236 lb)   07/21/23 120.2 kg (265 lb)        General Appearance:  A well-appearing female in no acute distress.    HEENT: Sclera anicteric, conjunctiva without pallor, moist mucous membranes, normal dentition.   Neck:  Supple without jugular venous distention.  Normal carotid  upstrokes without bruits.   Chest: Clear to auscultation bilaterally with good air movement and respiratory effort and no wheezes, rales, or rhonchi   Cardiac: RRR.  Normal S1 and physiologically split S2, without gallops or rub. No murmurs.   Vascular:  2+ carotid, radial pulses bilaterally  Abdomen: Soft, nontender, nondistended, with normoactive bowel sounds.  No bruits.   Extremities: mild edema, no clubbing, or cyanosis.   Skin: No rash, warm, appropriate for race.   Neuro: Alert and oriented x3. Grossly intact.  CN II-XII intact.  Normal mood and affect.     ECG:   Independent review shows:  NSR, L axis, low voltage    ASSESSMENT/PLAN:    Mobitz I AV block. Possible higher grade AV block but seems to be occurring during sleep so likely vagally mediated. Will repeat event monitor after she has been on CPAP. Follow for symptomatic bradycardia.    Abnormal EKG. New anterior TWIs. Will get a stress test - will need a pharmacologic stress test as she is not ambulatory.   OSA. She has a machine but has not had the titration study yet - planned for later this year. See above. SABRA   HL. On statin and ezetimibe.    Edema. Worsening edema on lasix . Will add spironolactone 12.5mg  daily and follow. Check BMP in 2 weeks.     All patient's questions and concerns regarding cardiovascular disease were answered during this visit.    Orders Placed This Encounter   Procedures    Lexiscan  Nuclear Stress Test    Basic Metabolic Panel    ECG 12 lead (Normal)       Return in about 4 weeks (around 03/28/2024).    Alm Argyle, MD  02/29/2024

## 2024-02-29 ENCOUNTER — Encounter (INDEPENDENT_AMBULATORY_CARE_PROVIDER_SITE_OTHER): Payer: Self-pay | Admitting: Cardiovascular Disease

## 2024-02-29 ENCOUNTER — Ambulatory Visit (INDEPENDENT_AMBULATORY_CARE_PROVIDER_SITE_OTHER): Admitting: Cardiovascular Disease

## 2024-02-29 VITALS — BP 115/75 | HR 73 | Ht 62.0 in | Wt 291.0 lb

## 2024-02-29 DIAGNOSIS — E782 Mixed hyperlipidemia: Secondary | ICD-10-CM

## 2024-02-29 DIAGNOSIS — R600 Localized edema: Secondary | ICD-10-CM

## 2024-02-29 DIAGNOSIS — I441 Atrioventricular block, second degree: Secondary | ICD-10-CM

## 2024-02-29 DIAGNOSIS — R9431 Abnormal electrocardiogram [ECG] [EKG]: Secondary | ICD-10-CM | POA: Insufficient documentation

## 2024-02-29 DIAGNOSIS — I455 Other specified heart block: Secondary | ICD-10-CM

## 2024-02-29 LAB — ECG 12-LEAD
Atrial Rate: 73 {beats}/min
IHS MUSE NARRATIVE AND IMPRESSION: NORMAL
P Axis: 61 degrees
P-R Interval: 158 ms
Q-T Interval: 356 ms
QRS Duration: 74 ms
QTC Calculation (Bezet): 392 ms
R Axis: -39 degrees
T Axis: -2 degrees
Ventricular Rate: 73 {beats}/min

## 2024-02-29 MED ORDER — SPIRONOLACTONE 25 MG PO TABS
12.5000 mg | ORAL_TABLET | Freq: Every day | ORAL | 3 refills | Status: DC
Start: 2024-02-29 — End: 2024-04-24

## 2024-02-29 NOTE — Telephone Encounter (Signed)
 Called patient and scheduled her with Dr. Theta on Aug. 13 at 9:00am

## 2024-02-29 NOTE — Patient Instructions (Signed)
 Stress test  Start spironolactone 12.5mg  daily  Labs in 2-3 weeks

## 2024-03-13 NOTE — Progress Notes (Signed)
 BiPAP set at 25/22 cm sent to [facility] via [facility].

## 2024-03-22 ENCOUNTER — Other Ambulatory Visit (INDEPENDENT_AMBULATORY_CARE_PROVIDER_SITE_OTHER)

## 2024-03-28 ENCOUNTER — Ambulatory Visit (INDEPENDENT_AMBULATORY_CARE_PROVIDER_SITE_OTHER): Admitting: Registered Nurse

## 2024-03-28 NOTE — Progress Notes (Deleted)
 [facility] OFFICE VISIT      ASSESSMENT & PLAN:   No diagnosis found.       ***  *Mobitz I AV block. Possible higher grade AV block but seems to be occurring during sleep so likely vagally mediated. Will repeat event monitor after she has been on CPAP. Follow for symptomatic bradycardia.      *Abnormal EKG. New anterior TWIs. Will get a stress test - will need a pharmacologic stress test as she is not ambulatory.     *OSA. She has a machine but has not had the titration study yet - planned for later this year. See above. .     *HL. On statin and ezetimibe.      *Edema. Worsening edema on lasix . Will add spironolactone 12.5mg  daily and follow. Check BMP in 2 weeks.   *  *    No follow-ups on file.    HISTORY OF PRESENT ILLNESS:  I had the pleasure of seeing Kristina [name] today in a cardiac follow up office visit for ***    Kristina [name] is a 63 y.o. female with a past medical history significant for Mobitz 1 AV block, chronic lower extremity edema, current smoker.    At today's visit Kristina [name] is doing well. *** She denies chest pain, shortness of breath, edema, palpitations, presyncope, or syncope.       Cardiographics:  Event monitor in 08/2021 showed evidence of higher grade AV block with pauses up to 4.7 seconds during sleeping hours.     Echo 01/19/24 showed EF of 65%, no sig valvular disease     Echo 10/21/21 showed EF of 55%, mild LVH, no sig valvular disease     Echo 05/02/17 showed EF of 55-60%, no sig valvular disease, ASA without shunt       ECG: ***    PMH:   Patient Active Problem List    Diagnosis Date Noted    Sinus pause 09/24/2021    Screening for colon cancer 07/14/2021    Cigarette smoker one half pack a day or less 04/11/2017    Hyperlipidemia 01/19/2014    Morbid obesity (CMS/HCC) 01/19/2014    Bilateral lower extremity edema 01/19/2014    Second degree atrioventricular block, Mobitz (type) I 01/18/2014    Allergic rhinitis 01/18/2014    Multiple thyroid  nodules 08/10/2012         MEDICATIONS:     Current Medications[1]     SH: Social History[2]    REVIEW OF SYSTEMS: All other systems reviewed and negative except as above.    PHYSICAL EXAMINATION ***  General Appearance: A well-appearing female in no acute distress.   Vital Signs: There were no vitals taken for this visit.   HEENT: Sclera anicteric, conjunctiva without pallor, moist mucous membranes, normal dentition.   Neck: Supple without jugular venous distention. Thyroid  nonpalpable. Normal carotid upstrokes without bruits.  Chest: Clear to auscultation bilaterally with good air movement and respiratory effort and no wheezes, rales, or rhonchi  Cardiovascular: Normal S1 and physiologically split S2 without murmurs, gallops or rub. PMI of normal size and nondisplaced.   Abdomen: Soft, nontender. No organomegaly.  No pulsatile masses or bruits.    Extremities: Warm without edema. All peripheral pulses are full and equal.  Skin: No rash, xanthoma or xanthelasma.   Neuro: Alert and oriented x3. Grossly intact. Strength is symmetrical. Normal mood and affect.       Basic Metabolic Profile   Lab Results  Component Value Date    NA 141 06/15/2023    K 4.4 06/15/2023    BUN 12 06/15/2023    CREAT 0.7 06/15/2023    MG 1.7 10/20/2021    CA 9.3 06/15/2023    GLU 87 06/15/2023         Cardiac Biomarkers   Lab Results   Component Value Date    BNP 37 01/18/2014        CBC with Diff   Lab Results   Component Value Date    WBC 6.54 06/15/2023    HGB 10.2 (L) 06/15/2023    HCT 34.3 (L) 06/15/2023    PLT 426 (H) 06/15/2023         Cholesterol Panel   Lab Results   Component Value Date    CHOL 172 08/06/2022    HDL 39 (L) 08/06/2022    LDL 115 (H) 08/06/2022    TRIG 89 08/06/2022         Endocrine   Lab Results   Component Value Date    HGBA1C 5.8 (H) 06/15/2023    HGBA1C 5.7 (H) 08/06/2022    HGBA1C 5.8 01/19/2014    TSH 1.83 08/06/2022         Coagulation Studies   Lab Results   Component Value Date    DDIMER 0.47 01/18/2014          ----------------------------  Avelina Church, AGNP-C  [facility]   Tel.: [phone], Fax: [phone]  [address], [place], [place] [address]  [address] [place], [place], [place] [address]  [address], [place], [place] [address]  [address], [address], [place], [place] [address]  _____________________________      Incident to service performed with physician present in the office. The physician's plan of care was implemented.            [1]   Current Outpatient Medications   Medication Sig Dispense Refill    albuterol sulfate HFA (PROVENTIL) 108 (90 Base) MCG/ACT inhaler Inhale 2 puffs into the lungs every 4 (four) hours as needed      atorvastatin  (LIPITOR) 40 MG tablet Take 1 tablet (40 mg) by mouth daily (Patient taking differently: Take 0.5 tablets (20 mg) by mouth once daily) 90 tablet 3    Cholecalciferol (Vitamin D ) 50 MCG (2000 UT) Cap TAKE 1 CAPSULE EVERY WEEK BY MOUTH FOR 84 DAYS      clotrimazole (LOTRIMIN) 1 % cream Apply topically as needed      diclofenac sodium (VOLTAREN) 1 % Gel topical gel Apply topically 4 (four) times daily      escitalopram (LEXAPRO) 10 MG tablet Take 1 tablet (10 mg) by mouth daily      ezetimibe (ZETIA) 10 MG tablet Take 1 tablet (10 mg) by mouth once daily      furosemide  (LASIX ) 40 MG tablet Take 1 tablet (40 mg total) by mouth daily. 30 tablet 3    iron-vitamin C (Vitron-C) 65-125 MG Tablet Take 1 tablet every day by oral route for 90 days.      lidocaine  (LIDODERM ) 5 % Place 1 patch onto the skin as needed      meclizine (ANTIVERT) 25 MG tablet Take 1 tablet (25 mg) by mouth every 8 (eight) hours as needed      metFORMIN (GLUCOPHAGE) 500 MG tablet TAKE ONE TABLET BY MOUTH EVERY DAY WITH MEALS      naproxen  (NAPROSYN ) 500 MG tablet  Take 1 tablet (500 mg) by mouth 2 (two) times daily with meals      oxyCODONE -acetaminophen  (PERCOCET) 5-325 MG per tablet TAKE 1/2 TO 1 FULL TABLET BY MOUTH EVERY 12 HOURS AS  NEEDED FOR PAIN WITH FOOD AND NONALCOHOLIC BEVERAGES (Patient not taking: Reported on 02/29/2024)      spironolactone (ALDACTONE) 25 MG tablet Take 0.5 tablets (12.5 mg) by mouth once daily 45 tablet 3    traZODone (DESYREL) 50 MG tablet as needed         triamcinolone  (KENALOG) 0.1 % cream APPLY CREAM EXTERNALLY TO AFFECTED AREA TWICE DAILY AS NEEDED 90 DAYS       No current facility-administered medications for this visit.   [2]   Social History  Tobacco Use    Smoking status: Every Day     Current packs/day: 0.50     Average packs/day: 0.5 packs/day for 40.0 years (20.0 ttl pk-yrs)     Types: Cigarettes     Passive exposure: Current    Smokeless tobacco: Never   Vaping Use    Vaping status: Never Used   Substance Use Topics    Alcohol use: No    Drug use: Never

## 2024-04-02 ENCOUNTER — Ambulatory Visit: Attending: Cardiovascular Disease

## 2024-04-02 ENCOUNTER — Ambulatory Visit (INDEPENDENT_AMBULATORY_CARE_PROVIDER_SITE_OTHER)

## 2024-04-02 DIAGNOSIS — R600 Localized edema: Secondary | ICD-10-CM | POA: Insufficient documentation

## 2024-04-02 LAB — BASIC METABOLIC PANEL
Anion Gap: 10 (ref 5.0–15.0)
BUN: 12 mg/dL (ref 7–21)
CO2: 27 meq/L (ref 17–29)
Calcium: 9.5 mg/dL (ref 8.5–10.5)
Chloride: 107 meq/L (ref 99–111)
Creatinine: 0.6 mg/dL (ref 0.4–1.0)
GFR: >60 mL/min/1.73 m2 (ref >=60.0)
Glucose: 92 mg/dL (ref 70–100)
Potassium: 4.6 meq/L (ref 3.5–5.3)
Sodium: 144 meq/L (ref 135–145)

## 2024-04-03 ENCOUNTER — Ambulatory Visit (INDEPENDENT_AMBULATORY_CARE_PROVIDER_SITE_OTHER): Payer: Self-pay | Admitting: Cardiovascular Disease

## 2024-04-18 ENCOUNTER — Encounter (INDEPENDENT_AMBULATORY_CARE_PROVIDER_SITE_OTHER): Payer: Self-pay | Admitting: Cardiovascular Disease

## 2024-04-18 NOTE — Progress Notes (Signed)
 THIS FORM IS FOR INTERNAL USE FOR CARDIOLOGY AND IS NOT A PROGRESS NOTE    Chart Prep Check List:  Completed Process Reviewed Notes   [x]  Review last office visit note. If "orders placed, ensure that the results are in the chart.    Refresh CareEverywhere- outside results may appear here.   If not in chart:         - Call the patient and clarify if they have done testing outside of [facility].             - If the patient has completed testing outside of [facility], request that the patient bring the results to their appointment or ask for facility information so you can call and request results to be faxed to clinic. Once faxed, scanned into chart.  - If unable to obtain, call patient and request for patient to bring physical copy to OV or provide in MyChart message.    If the patient has not completed testing, confirm with the provider if it is okay to proceed with the scheduled appointment or if the patient needs to schedule testing and follow up afterward. Contact designated PAA to reschedule appointment and diagnostic testing. Nuc not done going to discuss with Dr. Viviana at [facility]   [x]  Check to make sure all recent labs are available/completed  If PCP is within [facility], will appear in Epic.   If outside of [facility], call to obtain records. Put in blue sticky note labs are scanned, specify lab, and date of labs.   If no PCP in chart, call patient to confirm no recent labs from outside facility.    [x]  Pend EKG, if needed  If EKG is completed, please provide date of last EKG in blue sticky note     []  If cardiac clearance:  Add physician/surgeon to care team  Document physician, name of procedure, and fax to the right and in  blue sticky note   Check media and concord for cardiac clearance request. If in concord, upload the request to chart.         Disclaimer: If calling patient, please ensure check list is completed prior to have all information/ questions readily available.

## 2024-04-23 NOTE — Progress Notes (Unsigned)
 [facility] OFFICE VISIT      No chief complaint on file.      I had the pleasure of seeing [name] today for cardiovascular follow up. She is a pleasant 63 y.o. female with a history of obesity, tobacco use, HL, AV block, OSA, who presents for continued management.      She has a h/o mobitz I AV block that was found incidentally with pauses while sleeping. She has intermittent dizziness vs vertigo and takes meclizine PRN - no recent need for meclizine and symptoms seem to be improved. No h/o syncope.    She has chronic LE edema and takes lasix  40mg  daily. She states her edema has worsened with associated weight gain so I added spironolactone . At her last visit she had new TWIs on her EKG so I ordered a stress test which she has not yet had done. No CP with daily activities. She denies worsening SOB with exertion - ambulates with a cane. She has chronic numbness on her L side after a L knee cortisone injection. She has intermittent dizziness which improves with meclizine. No presyncope.     She continues to smoke about 5 cigs/day.     Event monitor in 08/2021 showed evidence of higher grade AV block with pauses up to 4.7 seconds during sleeping hours.    Echo 01/19/24 showed EF of 65%, no sig valvular disease    Echo 10/21/21 showed EF of 55%, mild LVH, no sig valvular disease    Echo 05/02/17 showed EF of 55-60%, no sig valvular disease, ASA without shunt      MEDICATIONS:     Current Outpatient Medications   Medication Sig Dispense Refill    albuterol sulfate HFA (PROVENTIL) 108 (90 Base) MCG/ACT inhaler Inhale 2 puffs into the lungs every 4 (four) hours as needed      atorvastatin  (LIPITOR) 40 MG tablet Take 1 tablet (40 mg) by mouth daily (Patient taking differently: Take 0.5 tablets (20 mg) by mouth once daily) 90 tablet 3    Cholecalciferol (Vitamin D ) 50 MCG (2000 UT) Cap TAKE 1 CAPSULE EVERY WEEK BY MOUTH FOR 84 DAYS      clotrimazole (LOTRIMIN) 1 % cream Apply topically as needed      diclofenac  sodium (VOLTAREN) 1 % Gel topical gel Apply topically 4 (four) times daily      escitalopram (LEXAPRO) 10 MG tablet Take 1 tablet (10 mg) by mouth daily      ezetimibe (ZETIA) 10 MG tablet Take 1 tablet (10 mg) by mouth once daily      furosemide  (LASIX ) 40 MG tablet Take 1 tablet (40 mg total) by mouth daily. 30 tablet 3    iron-vitamin C (Vitron-C) 65-125 MG Tablet Take 1 tablet every day by oral route for 90 days.      lidocaine  (LIDODERM ) 5 % Place 1 patch onto the skin as needed      meclizine (ANTIVERT) 25 MG tablet Take 1 tablet (25 mg) by mouth every 8 (eight) hours as needed      metFORMIN (GLUCOPHAGE) 500 MG tablet TAKE ONE TABLET BY MOUTH EVERY DAY WITH MEALS      naproxen  (NAPROSYN ) 500 MG tablet Take 1 tablet (500 mg) by mouth 2 (two) times daily with meals      oxyCODONE -acetaminophen  (PERCOCET) 5-325 MG per tablet TAKE 1/2 TO 1 FULL TABLET BY MOUTH EVERY 12 HOURS AS NEEDED FOR PAIN WITH FOOD AND NONALCOHOLIC BEVERAGES (Patient not taking: Reported  on 02/29/2024)      spironolactone  (ALDACTONE ) 25 MG tablet Take 0.5 tablets (12.5 mg) by mouth once daily 45 tablet 3    traZODone (DESYREL) 50 MG tablet as needed         triamcinolone  (KENALOG) 0.1 % cream APPLY CREAM EXTERNALLY TO AFFECTED AREA TWICE DAILY AS NEEDED 90 DAYS       No current facility-administered medications for this visit.       REVIEW OF SYSTEMS: All other systems reviewed and negative except as above.    PHYSICAL EXAMINATION  Vital Signs: There were no vitals taken for this visit.   Vital signs reviewed    Wt Readings from Last 3 Encounters:   02/29/24 132 kg (291 lb)   12/28/23 107 kg (236 lb)   07/21/23 120.2 kg (265 lb)        General Appearance:  A well-appearing female in no acute distress.    HEENT: Sclera anicteric, conjunctiva without pallor, moist mucous membranes, normal dentition.   Neck:  Supple without jugular venous distention.  Normal carotid upstrokes without bruits.   Chest: Clear to auscultation bilaterally with good  air movement and respiratory effort and no wheezes, rales, or rhonchi   Cardiac: RRR.  Normal S1 and physiologically split S2, without gallops or rub. No murmurs.   Vascular:  2+ carotid, radial pulses bilaterally  Abdomen: Soft, nontender, nondistended, with normoactive bowel sounds.  No bruits.   Extremities: mild edema, no clubbing, or cyanosis.   Skin: No rash, warm, appropriate for race.   Neuro: Alert and oriented x3. Grossly intact.  CN II-XII intact.  Normal mood and affect.     ECG:   Independent review shows:  NSR, L axis, low voltage    ASSESSMENT/PLAN:    Mobitz I AV block. Possible higher grade AV block but seems to be occurring during sleep so likely vagally mediated. Will repeat event monitor after she has been on CPAP. Follow for symptomatic bradycardia.    Abnormal EKG. New anterior TWIs. Will get a stress test - will need a pharmacologic stress test as she is not ambulatory.   OSA. She has a machine but has not had the titration study yet - planned for later this year. See above. SABRA   HL. On statin and ezetimibe.    Edema. Worsening edema on lasix . Will add spironolactone  12.5mg  daily and follow. Check BMP in 2 weeks.     All patient's questions and concerns regarding cardiovascular disease were answered during this visit.    No orders of the defined types were placed in this encounter.      No follow-ups on file.    Alm Argyle, MD  04/23/2024

## 2024-04-24 ENCOUNTER — Ambulatory Visit (INDEPENDENT_AMBULATORY_CARE_PROVIDER_SITE_OTHER): Admitting: Cardiovascular Disease

## 2024-04-24 ENCOUNTER — Ambulatory Visit (INDEPENDENT_AMBULATORY_CARE_PROVIDER_SITE_OTHER): Admitting: Registered Nurse

## 2024-04-24 ENCOUNTER — Encounter (INDEPENDENT_AMBULATORY_CARE_PROVIDER_SITE_OTHER): Payer: Self-pay | Admitting: Cardiovascular Disease

## 2024-04-24 VITALS — BP 136/83 | HR 85 | Wt 280.8 lb

## 2024-04-24 DIAGNOSIS — R600 Localized edema: Secondary | ICD-10-CM

## 2024-04-24 DIAGNOSIS — R9431 Abnormal electrocardiogram [ECG] [EKG]: Secondary | ICD-10-CM

## 2024-04-24 DIAGNOSIS — E782 Mixed hyperlipidemia: Secondary | ICD-10-CM

## 2024-04-24 DIAGNOSIS — I441 Atrioventricular block, second degree: Secondary | ICD-10-CM

## 2024-04-24 MED ORDER — SPIRONOLACTONE 25 MG PO TABS
25.0000 mg | ORAL_TABLET | Freq: Every day | ORAL | 3 refills | Status: AC
Start: 2024-04-24 — End: ?

## 2024-05-01 ENCOUNTER — Ambulatory Visit: Attending: Neurology | Admitting: Neurology

## 2024-05-01 ENCOUNTER — Encounter: Payer: Self-pay | Admitting: Neurology

## 2024-05-01 VITALS — BP 156/82 | HR 76 | Temp 98.8°F | Resp 16 | Ht 62.0 in | Wt 236.0 lb

## 2024-05-01 DIAGNOSIS — G4733 Obstructive sleep apnea (adult) (pediatric): Secondary | ICD-10-CM | POA: Insufficient documentation

## 2024-05-01 NOTE — Progress Notes (Signed)
 Neurology Progress Note    Patient Name:  Kristina [name],Kristina [name]  Date:  05/01/2024     HPI:    She has moderate OSA. I ordered CPAP titration which showed best results at [name] BiPAP setting of 25/22 cm water, with AHI of 2.6 and minimum O2 of 90%. She got her equipment last month and is using it most nights. She is sleeping better and feels more awake and alert during the day. She is having problems with mask leakage that has started to limit its use.       Notes from my 12/28/2023 OV:    She had been complaining of feeling tired during the day. She dozes off at TV and when she is inactive. She has loud snoring. She had [name] PSG on 12/24/2022 showing moderate OSA with 19 events per hour and severe desaturations with minimum O2 of 73%.     She goes to bed at 10:30 pm and falls asleep in < 10 minutes. She wakes up once during the night. She does not move around [name] lot at night. She gets up at 6 am and feels good. She has 1 cup of coffee.     She has daytime hypersomnia in the afternoon. The patient complains of low energy levels. There is reduced concentration and attention when inactive. The patient dozes off during the day if alone or inactive. She has good short term memory. The situation is worse if there is not enough sleep.              Notes from Dr. Winfield on 11/02/2022:     Telemedicine visit with video initiated. This information was discussed with the patient, who gave verbal consent to proceed with [name] telemedicine appointment.  This visit had to be started on [email or web address] because patient had technical difficulties connecting on epic. Then we after 10 mins on doxy we had technical difficulties so it had to be finsihed with [name] phone call     Chief Complaint   Mobitz I AV block. Referred from cardiology with Concern for sleep apnea.     History of Present Illness   She has been seeing cardiology for Mobitz I AV block and prolonged holter showed up to 4.7 second pauses at night while sleeping. Cardiology is concerned for possible  higher grade block. Here is [name] copy of Dr. Loran (cardiology) 07/2022 note Possible higher grade AV block. She was referred for [name] sleep study as her pause occurred during sleeping hours but she did not follow through. Follow for symptomatic bradycardia.        They endorse the following symptoms:  [x]  Habitual snoring  []  Apneas witnessed by bed partner  []  Awakens feeling unrefreshed by sleep  []  Excessive daytime sleepiness  []  Excessive daytime fatigue  []  Restlessness during sleep  []  Sudden awakenings with [name] sensation of gasping or choking  []  Dry mouth or sore throat upon awakening  []  Intellectual impairment, such as trouble concentrating, forgetfulness, or irritability  []  Excessive nighttime urinary frequency  []  Night sweats  []  Morning headaches on waking     She used to have trazodone for insomnia after her son and mother passed away but she rarely uses it not.  Subjectively, she thinks she sleeps peacefully.  She has been snoring for [name] long time.   She denies trouble falling or staying asleep.  She goes to bed at 8p. Watching [name] movie.  Falls asleep 10p.   Wakes at 3a to use bathroom.  Final wake time 7a.  She feels reasonably refreshed on waking.     She will doze during the day if just sitting watching TV.        EPWORTH SLEEPINESS SCALE    Total Score                                                                            9              STOP-BANG QUESTIONNAIRE     Do you snore loudly (louder than talking                                            YES  or loud enough to be heard through closed doors)?      Do you often feel tired, fatigued, or sleepy during daytime?             YES     Has anyone observed you stop breathing during your sleep?                       No     Do you have or are you being treated for high blood pressure?       YES     Is your BMI more than 35 kg/m2?                                                      YES     Are you over 33 years old?                                                                  YES     Is your neck circumference greater than 40cm?                                YES     Are you female?                                                                                                  No     High risk of OSA: answering yes to three or more items  Low risk of OSA: answering yes to less than three items  Ancillary Data:  Her previous sleep studies and medical records were reviewed with the patient.       Medications:    Current Outpatient Medications   Medication Instructions    albuterol sulfate HFA (PROVENTIL) 108 (90 Base) MCG/ACT inhaler 2 puffs, Every 4 hours PRN    atorvastatin  (LIPITOR) 40 mg, Oral, Daily    Cholecalciferol (Vitamin D ) 50 MCG (2000 UT) Cap TAKE 1 CAPSULE EVERY WEEK BY MOUTH FOR 84 DAYS    clotrimazole (LOTRIMIN) 1 % cream As needed    diclofenac sodium (VOLTAREN) 1 % Gel topical gel 4 times daily    escitalopram (LEXAPRO) 10 mg, Daily    ezetimibe (ZETIA) 10 mg, Daily    furosemide  (LASIX ) 40 mg, Oral, Daily    iron-vitamin C (Vitron-C) 65-125 MG Tablet Take 1 tablet every day by oral route for 90 days.    lidocaine  (LIDODERM ) 5 % 1 patch, As needed    meclizine (ANTIVERT) 25 mg, Every 8 hours PRN    metFORMIN (GLUCOPHAGE) 500 MG tablet TAKE ONE TABLET BY MOUTH EVERY DAY WITH MEALS    naproxen  (NAPROSYN ) 500 mg, 2 times daily with meals    oxyCODONE -acetaminophen  (PERCOCET) 5-325 MG per tablet TAKE 1/2 TO 1 FULL TABLET BY MOUTH EVERY 12 HOURS AS NEEDED FOR PAIN WITH FOOD AND NONALCOHOLIC BEVERAGES    spironolactone  (ALDACTONE ) 25 mg, Oral, Daily    traZODone (DESYREL) 50 MG tablet As needed    triamcinolone  (KENALOG) 0.1 % cream APPLY CREAM EXTERNALLY TO AFFECTED AREA TWICE DAILY AS NEEDED 90 DAYS       The following portions of the patient's history were reviewed and updated as appropriate: allergies, current medications, past family history, past medical history, past social history, past surgical history and problem list.      Review of  Systems:    The patient denies loss of consciousness or syncope. There is no chest pain or shortness of breath. All systems were reviewed and were negative except as described in the HPI.    Active Ambulatory Problems     Diagnosis Date Noted    Multiple thyroid  nodules 08/10/2012    Second degree atrioventricular block, Mobitz (type) I 01/18/2014    Allergic rhinitis 01/18/2014    Hyperlipidemia 01/19/2014    Morbid obesity (CMS/HCC) 01/19/2014    Bilateral lower extremity edema 01/19/2014    Cigarette smoker one half pack [name] day or less 04/11/2017    Screening for colon cancer 07/14/2021    Sinus pause 09/24/2021     Resolved Ambulatory Problems     Diagnosis Date Noted    Tingling in extremities 08/10/2012    Chest pain 01/18/2014    Dyspnea on exertion 01/18/2014    Muscle weakness 12/12/2017    Abnormal ECG 02/29/2024     Past Medical History:   Diagnosis Date    Arthritis     Claustrophobia     Depression     Difficulty walking     Dizziness     Neuromyopathy (CMS/HCC)     Seasonal allergic rhinitis     Shortness of breath     Wears glasses      Problem List[1]        Exam       BP 156/82 (BP Site: Left arm, Patient Position: Sitting, Cuff Size: Large)   Pulse 76   Temp 98.8 F (37.1 C) (Oral)   Resp 16   Ht 1.575 m (5' 2)   Wt 107 kg (  236 lb)   SpO2 100%   BMI 43.16 kg/m     The patient is well developed and well nourished, sitting comfortably in the exam room in no acute distress. Patient is awake and alert. Speech is normal without aphasia. Cognitive function is intact, oriented times three. Recent and remote memory are intact.    Cranial nerves reveal intact extraocular muscles with conjugate movements. There is no ptosis. The face is symmetric.     Motor exam is intact with equal strength bilaterally. Muscle tone and bulk are normal. There are no fasciculations and no atrophy.     Coordination is intact. There is no dysmetria. Gait is intact with no ataxia.        Assessment:    1)  Moderate OSA  with severe O2 desaturations, responding very well to treatment with BiPAP. She is using it and it works. She has been having problems with excessive leakage.   OSA is [name] chronic condition which negatively impacts quality of life and also increases risk of comorbidities including HTN, DM, possible cardiac arrhythmias, stroke and MI. There is increased risk of motor vehicle accidents and possible injury to self and others.  2)  Obesity  3)  First degree AV block  4)  Depression  5)  HLD  6)  Chronic pain with daily narcotic use      Plan:    1)  Mask fitting for excessive leakage  2)  Lets reduce pressure to 22/18 cm water    Follow up in 4 months      Dewitt Judice B. Wyman, MD  [facility]  Board certified in Neurology,   Clinical Neurophysiology and Sleep Medicine  [phone] [facility] office             [1]   Patient Active Problem List  Diagnosis    Multiple thyroid  nodules    Second degree atrioventricular block, Mobitz (type) I    Allergic rhinitis    Hyperlipidemia    Morbid obesity (CMS/HCC)    Bilateral lower extremity edema    Cigarette smoker one half pack [name] day or less    Screening for colon cancer    Sinus pause

## 2024-05-02 ENCOUNTER — Encounter (INDEPENDENT_AMBULATORY_CARE_PROVIDER_SITE_OTHER): Payer: Self-pay

## 2024-05-02 ENCOUNTER — Encounter (INDEPENDENT_AMBULATORY_CARE_PROVIDER_SITE_OTHER): Payer: Self-pay | Admitting: Cardiovascular Disease

## 2024-05-02 ENCOUNTER — Ambulatory Visit (INDEPENDENT_AMBULATORY_CARE_PROVIDER_SITE_OTHER)

## 2024-05-02 DIAGNOSIS — R9431 Abnormal electrocardiogram [ECG] [EKG]: Secondary | ICD-10-CM

## 2024-05-02 MED ORDER — TECHNETIUM TC 99M TETROFOSMIN IV KIT
10.0000 | PACK | Freq: Once | INTRAVENOUS | Status: AC | PRN
Start: 2024-05-02 — End: 2024-05-02
  Administered 2024-05-02: 10 via INTRAVENOUS

## 2024-05-02 MED ORDER — REGADENOSON 0.4 MG/5ML IV SOLN
0.4000 mg | Freq: Once | INTRAVENOUS | Status: AC | PRN
Start: 2024-05-02 — End: 2024-05-02
  Administered 2024-05-02: 0.4 mg via INTRAVENOUS
  Filled 2024-05-02: qty 5

## 2024-05-02 MED ORDER — TECHNETIUM TC 99M TETROFOSMIN IV KIT
35.0000 | PACK | Freq: Once | INTRAVENOUS | Status: AC | PRN
Start: 2024-05-02 — End: 2024-05-02
  Administered 2024-05-02: 35 via INTRAVENOUS

## 2024-05-02 NOTE — Progress Notes (Signed)
 BiPAP pressure reduced to 22/18cm and mask fitting sent to [facility] via [facility].

## 2024-05-03 LAB — NM MYOCARDIAL PERFUSION SPECT W STRESS AND REST: Stress LV Ejection Fraction: 73

## 2024-05-29 NOTE — Progress Notes (Signed)
 [facility] CONS[facility]    Patient Name: Kristina [name],Kristina [name]    Date of Visit:  05/30/2024  Date of Birth: [birth year]  AGE: 63 y.o.  Medical Record #: [id number]  Requesting Physician: Rolan KATHEE Mau, MD    HISTORY OF PRESENT ILLNESS:  Initial history: 05/30/2024: Dr. Theta  History of Present Illness  Kristina [name] is [name] 63 y.o. female with [name] past medical history of seasonal allergies, OSA, HLD, AV block (type I Mobitz), intermittent vertigo (on meclizine as needed), chronic lower extremity edema, allergic rhinitis, current smoker/tobacco abuse, presenting for pulmonary consultation of dyspnea, wheezing. She was referred by her cardiologist, Dr. Janifer, for evaluation of wheezing and abnormal breathing patterns.  Per review of chart, recently followed with cardiology, 04/24/2024: Mobitz 1 AV block: Possible high-grade AV block, seems to be occurring during sleep, likely vagally mediated.  Plan to repeat event monitor, after she has been on CPAP, and will follow-up for symptomatic bradycardia.  Noted to have CPAP machine, but has had [name] titration study, and is scheduled for follow-up next week with her sleep specialist (neurology).    She has been experiencing wheezing, shortness of breath, and [name] persistent cough since having [name] URI in 09/2023. The cough is deep and persistent, with wheezing more pronounced late at night. She has been using albuterol  for the past four to six months, initially using it more frequently than recommended, about three to four times [name] day, especially during activities like cleaning. She experiences SOB with minimal exertion, including walking and climbing stairs, and reports being short of breath even during basic household activities. She denies [name] morning cough but reports constant wheezing. She reports she expectorates thick white sputum sometimes, but reports that her sputum is clear most of the time.    She has [name] significant smoking history, having started at the age of 13 and currently smoking  half [name] pack per day. She experiences shortness of breath when walking up [name] small hill or half [name] flight of stairs. Seasonal allergies exacerbate her symptoms, with spring and summer being the worst seasons.    She reports chronic lower extremity edema, describing it as persistent swelling in her legs. She has not been hospitalized for pneumonia in the past.    She uses [name] cane due to [name] past incident in 2016 when she received [name] cortisone shot in her left thigh, resulting in numbness for two years.           Social History: She began smoking at age 72 and is [name] current smoker, averaging 0.5 packs per day. She denies use of chewing tobacco, recreational drug use, or illicit substances. 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   Family History: She denies any family history of lung cancer.    MEDICATIONS:  Current Medications[1]      PHYSICAL EXAM:  Vitals:    05/30/24 0822   BP: 119/79   Pulse: 86   Resp: 20   Temp: 97.6 F (36.4 C)   SpO2: 97%      Physical Exam  Pulmonary:      Breath sounds: No wheezing.      Comments: Shallow breath sounds throughout  Musculoskeletal:      Right lower leg: Edema present.      Left lower leg: Edema present.           05/01/2024 05/02/2024 05/30/2024   Weight Monitoring   Height 157.5 cm 157.5 cm    Weight 107.049 kg  112.492 kg 130.45 kg   BMI (calculated) 43.2 kg/m2 45.3 kg/m2        LABS:  Lab Results   Component Value Date    WBC 6.54 06/15/2023    HCT 34.3 (L) 06/15/2023    HGB 10.2 (L) 06/15/2023    PLT 426 (H) 06/15/2023    ABSEOSAUTOMA 0.45 (H) 06/15/2023    ABSEOSAUTOMA 0.21 08/06/2022    ABSEOSAUTOMA 0.23 01/18/2014     Lab Results   Component Value Date    GLU 92 04/02/2024    ALKPHOS 64 06/15/2023    AST 12 06/15/2023    ALT 6 06/15/2023    CREAT 0.6 04/02/2024    CO2 27 04/02/2024      PULMONARY DIAGNOSTICS:    Spirometry on 05/30/2024: FVC: 0.99 L / 40%, FEV1 0.79 L / 40%, FEV1/FVC 79.84%.  Impression: Poor technique, possible  obstruction/restriction.    IMAGING:  CXR [facility] 07/04/2023: Comparison 08/19/2016.  Findings: Both lungs are clear.    CXR 07/10/2021: Comparison 02/27/2018: Findings: Lungs are clear without focal infiltrate, no pleural effusion.    Bilateral lower extremity Dopplers 04/30/2021: Normal venous duplex of the lower extremities.  No evidence of DVT    MRI pituitary 08/09/2012: Pituitary gland is normal in contour and signal characteristics.  The suprasellar cistern is widely patent and the stalk is midline in position.  Normal MRI of the pituitary gland    CT head 08/08/2012: Sinuses: Extensive paranasal sinus disease involving the ethmoid sinuses bilaterally and left maxillary sinus.  Left maxillary sinus appears expanded and underlying mass cannot be excluded.    CT sinus 10/30/2007: There is nodular, circumferential mucosal thickening involving the left maxillary sinus resulting in near complete obstruction of the sinus.  Ipsilateral obstruction of the ostiomeatal complexes also noticed.  Patchy mucosal thickening involving the right ethmoid sinus.    Cardiac studies:  TTE on 01/18/2024: LVEF 65%, RV cavity is normal in size, normal RV systolic function, LA is normal in size, RA is normal in size, trace TR.  Insufficient TR jet.  TAPSE 2.11 cm.    Nuclear medicine myocardial perfusion study 04/02/2024: Normal stress and rest myocardial perfusion study without evidence of inducible ischemia or scar.  LVEF greater than 70%.  Compared to 01/2014, there is no significant change.    IMPRESSION & PLAN:  Kristina [name] is [name] 63 y.o. female with [name] past medical history of OSA, HLD, AV block (type I Mobitz), intermittent vertigo (on meclizine as needed), chronic lower extremity edema, allergic rhinitis, current smoker/tobacco abuse, presenting for pulmonary consultation of dyspnea, wheezing.    Assessment & Plan  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  Suspected smoking-related lung disease with chronic cough  and wheezing  Current symptoms: Chronic cough x 6/8 months after December 2020 for URI, associated with dyspnea with minimal exertion, and 1-2 ADLs  Current inhalers: Albuterol   Current albuterol  use: 3-4 times per day for the past 3-4 months  Significant smoking history, at least half [name] pack [name] day for 50 years  Exacerbating factors: Continued smoking, obesity, sedentary lifestyle, seasonal allergies, untreated OSA  Spirometry 8/13: Possible mixed restriction and obstruction, however noted with poor technique  mMRC 3, CAT 16  May benefit from trial of bronchodilator (ICS/LABA/LAMA): Trelegy 100  Plan: Trelegy 100, PR albuterol , allergy labs, PFTs, 6-minute walk test, allergy labs, OSA optimization with her neurologist    Dyspnea on exertion  Multifactorial: Obesity, deconditioning, chronic lower extremity edema, chronic left lower  extremity pain/discomfort, continue smoking, Mobitz type I AV block    Bronchospasms    Seasonal allergic rhinitis    Symptoms worsen in spring and summer due to pollen and grass exposure, possibly contributing to respiratory symptoms.  8/13: Attempted FeNo, patient unable to perform proper technique.  Plan: Check allergy labs, readdress another FeNo at follow-up visit    OSA (follow with neurology)  PSG on 12/24/2022 indicated moderate OSA with an AHI of 18.6   She had [name] titration study on 02/21/2024  Follow up with Neurology.     Tobacco Cessation   Counseling regarding benefits of smoking cessation strategies was provided for greater than 10 minutes.  Educated that at this time smoking- cessation represents the single most important step that patient can take to enhance the length and quality of live.   Educated patient regarding alternatives of behavior interventions, pharmacotherapy including NRT and non-nicotine therapy such, and combinations of both.   Patient at this time will try to quit on their own.           Return in about 3 months (around 08/30/2024). Pt is advised to call sooner  if any worsening respiratory issues arise.                                              Orders Placed This Encounter   Procedures    CT Chest High Resolution Without Contrast    ALLERGEN PROF REG 2 MID-ATLANTIC  Zone 2    Immunoglobulin E (IgE), Quantitative    Eosinophil count    Alpha-1-Antitrypsin    Spirometry    Pulmonary Function Test WITH Bronchodilator    6 Minute Walk Test     [name] total of 48 minutes was spent on this visit reviewing previous notes, counseling the patient and/or family members regarding the patient's condition(s), ordering of tests, managing medications, and documenting the findings in the note.     I personally scribed for Jorie Cha, MD on 05/30/2024. The documentation recorded by the scribe, Temidayo Adebo, accurately reflects the service I personally performed and the decisions made by me, Jorie Cha, MD.    SIGNED:    Jorie Cha, MD   Pulmonology         [1]   Current Outpatient Medications:     albuterol  sulfate HFA (PROVENTIL ) 108 (90 Base) MCG/ACT inhaler, Inhale 2 puffs into the lungs every 4 (four) hours as needed, Disp: , Rfl:     atorvastatin  (LIPITOR) 40 MG tablet, Take 1 tablet (40 mg) by mouth daily (Patient taking differently: Take 0.5 tablets (20 mg) by mouth once daily), Disp: 90 tablet, Rfl: 3    Cholecalciferol (Vitamin D ) 50 MCG (2000 UT) Cap, TAKE 1 CAPSULE EVERY WEEK BY MOUTH FOR 84 DAYS, Disp: , Rfl:     escitalopram (LEXAPRO) 10 MG tablet, Take 1 tablet (10 mg) by mouth once daily, Disp: , Rfl:     ezetimibe (ZETIA) 10 MG tablet, Take 1 tablet (10 mg) by mouth once daily, Disp: , Rfl:     furosemide  (LASIX ) 40 MG tablet, Take 1 tablet (40 mg total) by mouth daily., Disp: 30 tablet, Rfl: 3    iron-vitamin C (Vitron-C) 65-125 MG Tablet, Take 1 tablet every day by oral route for 90 days., Disp: , Rfl:     metFORMIN (GLUCOPHAGE) 500 MG tablet, , Disp: ,  Rfl:     naproxen  (NAPROSYN ) 500 MG tablet, Take 1 tablet (500 mg) by mouth 2 (two) times daily with meals,  Disp: , Rfl:     spironolactone  (ALDACTONE ) 25 MG tablet, Take 1 tablet (25 mg) by mouth once daily, Disp: 90 tablet, Rfl: 3    traZODone (DESYREL) 50 MG tablet, as needed, Disp: , Rfl:     clotrimazole (LOTRIMIN) 1 % cream, Apply topically as needed, Disp: , Rfl:     diclofenac sodium (VOLTAREN) 1 % Gel topical gel, Apply topically 4 (four) times daily, Disp: , Rfl:     lidocaine  (LIDODERM ) 5 %, Place 1 patch onto the skin as needed, Disp: , Rfl:     meclizine (ANTIVERT) 25 MG tablet, Take 1 tablet (25 mg) by mouth every 8 (eight) hours as needed, Disp: , Rfl:     oxyCODONE -acetaminophen  (PERCOCET) 5-325 MG per tablet, TAKE 1/2 TO 1 FULL TABLET BY MOUTH EVERY 12 HOURS AS NEEDED FOR PAIN WITH FOOD AND NONALCOHOLIC BEVERAGES (Patient not taking: Reported on 05/01/2024), Disp: , Rfl:     triamcinolone  (KENALOG) 0.1 % cream, APPLY CREAM EXTERNALLY TO AFFECTED AREA TWICE DAILY AS NEEDED 90 DAYS, Disp: , Rfl:

## 2024-05-30 ENCOUNTER — Ambulatory Visit (INDEPENDENT_AMBULATORY_CARE_PROVIDER_SITE_OTHER): Admitting: Internal Medicine

## 2024-05-30 ENCOUNTER — Encounter: Payer: Self-pay | Admitting: Internal Medicine

## 2024-05-30 VITALS — BP 119/79 | HR 86 | Temp 97.6°F | Resp 20 | Wt 287.6 lb

## 2024-05-30 DIAGNOSIS — J301 Allergic rhinitis due to pollen: Secondary | ICD-10-CM

## 2024-05-30 DIAGNOSIS — F17200 Nicotine dependence, unspecified, uncomplicated: Secondary | ICD-10-CM

## 2024-05-30 DIAGNOSIS — J4540 Moderate persistent asthma, uncomplicated: Secondary | ICD-10-CM

## 2024-05-30 DIAGNOSIS — G4733 Obstructive sleep apnea (adult) (pediatric): Secondary | ICD-10-CM

## 2024-05-30 DIAGNOSIS — R062 Wheezing: Secondary | ICD-10-CM

## 2024-05-30 DIAGNOSIS — R0602 Shortness of breath: Secondary | ICD-10-CM

## 2024-05-30 DIAGNOSIS — J309 Allergic rhinitis, unspecified: Secondary | ICD-10-CM

## 2024-05-30 DIAGNOSIS — F17210 Nicotine dependence, cigarettes, uncomplicated: Secondary | ICD-10-CM

## 2024-05-30 DIAGNOSIS — J9801 Acute bronchospasm: Secondary | ICD-10-CM

## 2024-05-30 LAB — SPIROMETRY
FEF 25-75% (Pre-Bronch) %Pred: 43 %
FEF 25-75% (Pre-Bronch) Actual: 0.77 L/s
FEF 25-75% (Pre-Bronch) Pred: 1.78 L/s
FEF Max (Pre-Bronch) %Pred: 33 %
FEF Max (Pre-Bronch) Actual: 1.6 L/s
FEF Max (Pre-Bronch) Pred: 4.81 L/s
FEF2575 (Lower Limit of Normal): 0.73 L/s
FEF2575 (Standard Deviation): 0.78 L/s
FEFMax (Lower Limit of Normal): 2.95 L/s
FEFMax (Standard Deviation): 1.13 L/s
FEV1 (Lower Limit of Normal): 1.38 L
FEV1 (Pre-Bronch) %Pred: 40 %
FEV1 (Pre-Bronch) Actual: 0.79 L
FEV1 (Pre-Bronch) Pred: 1.92 L
FEV1 (Standard Deviation): 0.32 L
FEV1/FVC (Pre-Bronch) %Pred: 100 %
FEV1/FVC (Pre-Bronch) Actual: 80 %
FEV1/FVC (Pre-Bronch) Pred: 80 %
FVC (Lower Limit of Normal): 1.76 L
FVC (Pre-Bronch) %Pred: 40 %
FVC (Pre-Bronch) Actual: 0.99 L
FVC (Pre-Bronch) Pred: 2.42 L
FVC (Standard Deviation): 0.41 L
PEF (Pre-Actual): 96 L/min

## 2024-05-30 MED ORDER — ALBUTEROL SULFATE HFA 108 (90 BASE) MCG/ACT IN AERS
2.0000 | INHALATION_SPRAY | Freq: Four times a day (QID) | RESPIRATORY_TRACT | 2 refills | Status: AC | PRN
Start: 2024-05-30 — End: ?

## 2024-05-30 MED ORDER — TRELEGY ELLIPTA 100-62.5-25 MCG/ACT IN AEPB
1.0000 | INHALATION_SPRAY | Freq: Every morning | RESPIRATORY_TRACT | 5 refills | Status: DC
Start: 2024-05-30 — End: 2024-07-16

## 2024-05-30 NOTE — Patient Instructions (Addendum)
 Follow up with Dr. Theta in 3 months at [facility] clinic.    -perform full PFTs and 6 minute walk test, at next visit or 1-2 weeks before next scheduled visit (do not use any inhalers or any form of smoking 24-48 hours prior to this exam).   - HRCT Chest without contrast, perform 2 week prior to follow-up visit.  -  alpha-1 antitrypsin level and blood allergy zone 2 testing , total IgE, Absolute eosinophil count   - TRELEGY 100 mcg :1 puff daily in the AM. Gargle and rinse  with mouthwash (non-alcoholic preferably)  after each use.   -  Inhaler teaching performed today at the office with the demonstration of the proper technique.   - STOP TRELEGY if the following develop - severe dry mouth, heart palpitations, urinary incontinence, abdominal muscle cramping, constipation, nauseas, vomiting, blurred vision.   - albuterol  inhaler (RESCUE INHALER) - 2puff every 3-4 hours ONLY as needed for SEVERE shortness of breath\wheezing\recurrent cough. THIS IS A RESCUE INHALER FOR SEVERE SYMPTOMS ONLY.   - MAINTENANCE and RESCUE treatments discussed in detail with the patient.   - tobacco cessation.   - follow up with Dr. Wyman for your OSA and starting Bipap  - May 30, 2024 FeNO:  unable to perform

## 2024-06-07 ENCOUNTER — Other Ambulatory Visit: Payer: Self-pay | Admitting: Family Medicine

## 2024-06-07 DIAGNOSIS — E041 Nontoxic single thyroid nodule: Secondary | ICD-10-CM

## 2024-06-07 DIAGNOSIS — Z13820 Encounter for screening for osteoporosis: Secondary | ICD-10-CM

## 2024-06-15 ENCOUNTER — Other Ambulatory Visit: Payer: Self-pay | Admitting: Family Medicine

## 2024-06-15 ENCOUNTER — Ambulatory Visit
Admission: RE | Admit: 2024-06-15 | Discharge: 2024-06-15 | Disposition: A | Discharge status: Home / Self Care | Source: Ambulatory Visit | Attending: Family Medicine | Admitting: Family Medicine

## 2024-06-15 ENCOUNTER — Ambulatory Visit
Admission: RE | Admit: 2024-06-15 | Discharge: 2024-06-15 | Disposition: A | Discharge status: Home / Self Care | Source: Ambulatory Visit | Attending: Internal Medicine | Admitting: Internal Medicine

## 2024-06-15 DIAGNOSIS — E041 Nontoxic single thyroid nodule: Secondary | ICD-10-CM | POA: Insufficient documentation

## 2024-06-15 DIAGNOSIS — Z13820 Encounter for screening for osteoporosis: Secondary | ICD-10-CM

## 2024-06-15 DIAGNOSIS — R0602 Shortness of breath: Secondary | ICD-10-CM | POA: Insufficient documentation

## 2024-06-15 DIAGNOSIS — R062 Wheezing: Secondary | ICD-10-CM | POA: Insufficient documentation

## 2024-06-15 DIAGNOSIS — M25511 Pain in right shoulder: Secondary | ICD-10-CM | POA: Insufficient documentation

## 2024-06-15 DIAGNOSIS — Z780 Asymptomatic menopausal state: Secondary | ICD-10-CM | POA: Insufficient documentation

## 2024-06-20 ENCOUNTER — Ambulatory Visit
Admission: RE | Admit: 2024-06-20 | Discharge: 2024-06-20 | Disposition: A | Discharge status: Home / Self Care | Source: Ambulatory Visit | Attending: Internal Medicine | Admitting: Internal Medicine

## 2024-06-20 ENCOUNTER — Ambulatory Visit (HOSPITAL_BASED_OUTPATIENT_CLINIC_OR_DEPARTMENT_OTHER)
Admission: RE | Admit: 2024-06-20 | Discharge: 2024-06-20 | Disposition: A | Discharge status: Home / Self Care | Source: Ambulatory Visit | Attending: Internal Medicine | Admitting: Internal Medicine

## 2024-06-20 DIAGNOSIS — R062 Wheezing: Secondary | ICD-10-CM

## 2024-06-20 DIAGNOSIS — R0602 Shortness of breath: Secondary | ICD-10-CM

## 2024-06-20 LAB — PULMONARY FUNCTION TEST (COMBINED)
DL/VA- %Pred: 112 %
DL/VA- Actual: 4.89 ml/min/mmHg/L
DL/VA- Pred: 4.35 ml/min/mmHg/L
DLCOunc (Lower Limit of Normal): 13.33 ml/min/mmHg
DLCOunc- %Pred: 110 %
DLCOunc- Actual: 19.32 ml/min/mmHg
DLCOunc- Pred: 17.51 ml/min/mmHg
ERV (Pre-Bronch) %Pred: 10 %
ERV (Pre-Bronch) Actual: 0.07 L
ERV (Pre-Bronch) Pred: 0.65 L
ERVSB (Pre-Actual): 0.07 L
ERVSVC (Lower Limit of Normal): 0.21 L
FEF 25-75% (Pre-Bronch) %Pred: 53 %
FEF 25-75% (Pre-Bronch) Actual: 0.9 L/s
FEF 25-75% (Pre-Bronch) Pred: 1.7 L/s
FEF Max (Pre-Bronch) %Pred: 41 %
FEF Max (Pre-Bronch) Actual: 1.91 L/s
FEF Max (Pre-Bronch) Pred: 4.62 L/s
FEF2575 (Lower Limit of Normal): 0.7 L/s
FEF2575 (Standard Deviation): 0.75 L/s
FEFMax (Lower Limit of Normal): 2.75 L/s
FEFMax (Standard Deviation): 1.13 L/s
FEV1 (Lower Limit of Normal): 1.29 L
FEV1 (Pre-Bronch) %Pred: 62 %
FEV1 (Pre-Bronch) Actual: 1.11 L
FEV1 (Pre-Bronch) Pred: 1.79 L
FEV1 (Standard Deviation): 0.3 L
FEV1/FVC (Pre-Bronch) %Pred: 93 %
FEV1/FVC (Pre-Bronch) Actual: 75 %
FEV1/FVC (Pre-Bronch) Pred: 80 %
FRC (N2) (Pre-Bronch) %Pred: 85 %
FRC (N2) (Pre-Bronch) Actual: 1.97 L
FRC (N2) (Pre-Bronch) Pred: 2.31 L
FRCN2 (Lower Limit of Normal): 1.65 L
FVC (Lower Limit of Normal): 1.63 L
FVC (Pre-Bronch) %Pred: 65 %
FVC (Pre-Bronch) Actual: 1.48 L
FVC (Pre-Bronch) Pred: 2.25 L
FVC (Standard Deviation): 0.38 L
IC (Pre-Bronch) %Pred: 70 %
IC (Pre-Bronch) Actual: 1.48 L
IC (Pre-Bronch) Pred: 2.09 L
ICSVC (Lower Limit of Normal): 1.4 L
ICSVC (Upper Limit of Normal): 2.79 L
ICTLCSB (Pre-Actual): 36 %
PEF (Pre-Actual): 114.8 L/min
RVSB (Pre-Actual): 2.27 L
RVTLCSB (Pre-Actual): 55 %
SVCSB (Pre-Actual): 1.84 L
TLC (N2) (Pre-Bronch) %Pred: 78 %
TLC (N2) (Pre-Bronch) Actual: 3.45 L
TLC (N2) (Pre-Bronch) Pred: 4.38 L
TLCN2 (Lower Limit of Normal): 3.52 L
VA (% Predicted-Pre): 98 %
VA (Lower Limit of Normal): 3.24 L
VA (Pre-Actual): 3.95 L
VA- Pred: 4.03 L

## 2024-06-20 MED ORDER — ALBUTEROL SULFATE (2.5 MG/3ML) 0.083% IN NEBU
2.5000 mg | INHALATION_SOLUTION | Freq: Four times a day (QID) | RESPIRATORY_TRACT | Status: DC | PRN
Start: 2024-06-20 — End: 2024-06-20

## 2024-06-20 MED ORDER — ALBUTEROL SULFATE (2.5 MG/3ML) 0.083% IN NEBU
2.5000 mg | INHALATION_SOLUTION | Freq: Once | RESPIRATORY_TRACT | Status: DC
Start: 2024-06-20 — End: 2024-06-21

## 2024-06-20 MED ORDER — ALBUTEROL SULFATE (2.5 MG/3ML) 0.083% IN NEBU
INHALATION_SOLUTION | RESPIRATORY_TRACT | Status: AC
Start: 2024-06-20 — End: 2024-06-20
  Filled 2024-06-20: qty 3

## 2024-06-20 NOTE — Progress Notes (Signed)
 06/20/24 1200   Six Minute Walk   Status Completed   $ Pulm Stress Test Performed? Simple Pulm Stress Test - CPT [phone]   Oximeter probe site Forehead   Resting RA SpO2 94 %   Baseline FiO2 / O2  0.21   Baseline SpO2  94 %   Baseline HR 84   Baseline BP 124/84   Baseline BORG level 1   1 minute distance in feet 75 feet   1 minute distance in meters 22.9 meters   1 minute SpO2 93 %   1 minute heart rate 110 BPM   1 minute BORG level 3   2 minute SpO2 93 %   2 minute heart rate 113 BPM   2 minute BORG level 4   3 minute distance in feet 215 feet   3 minute distance in meters 65.5 meters   3 minute SpO2 93 %   3 minute heart rate 116 BPM   3 minute BORG level 4   6 minute distance in feet 440 feet   6 minute distance in meters 134.1 meters   6 minute SpO2 93 %   6 minute heart rate 118 BPM   6 minute BORG level 4   Recovery time (min) 1   Recovery FiO2 / O2  0.21   Recovery BP 132/85   Recovery SpO2 95 %   Recovery heart rate 86 BPM   Recovery BORG level 3   Reason stopped Time limit   Supportive devices Walker w/wheels   Adverse Reactions Dyspnea

## 2024-06-20 NOTE — Progress Notes (Signed)
 06/20/24 1230   Spirometry Pre and/or Post   Procedure Location and Type Diagnostic - Pre Only   Pre FVC 1.48 L   Pre FEV1 1.11 L   Pre FEV1/FVC % (Calculated) 75   Patient Effort Fair   Adverse Reactions None   Lung Volumes   Method Nitrogen wash out   Procedure status Completed   Adverse Reactions None   DLCO   Procedure status Completed   Adverse Reactions None

## 2024-06-25 ENCOUNTER — Telehealth (INDEPENDENT_AMBULATORY_CARE_PROVIDER_SITE_OTHER): Payer: Self-pay

## 2024-06-25 ENCOUNTER — Telehealth: Payer: Self-pay | Admitting: Internal Medicine

## 2024-06-25 NOTE — Telephone Encounter (Signed)
 Called and scheduled patient for a follow up to go over pft and 6mw results

## 2024-06-25 NOTE — Telephone Encounter (Signed)
Results available for your review

## 2024-06-25 NOTE — Telephone Encounter (Signed)
 Copied from CRM [id number]. Topic: Non-Appointment Question - More Information  >> Jun 25, 2024  8:57 AM Taylour T wrote:  [name], [name] called about Non-Appointment Question - More Information.  Additional details:  Pt called and said that she completed her PFT and and would like Dr Theta to review them- pt tried to make a Fu appt but only requested MV and uses transport so it is to be a 5 day notice- please advise [phone]

## 2024-07-03 ENCOUNTER — Ambulatory Visit: Admitting: Neurology

## 2024-07-15 NOTE — Progress Notes (Unsigned)
 [facility] PULMONARY CONSULT    Patient Name: Kristina [name],Kristina [name]    Date of Visit:  07/16/2024  Date of Birth: [birth year]  AGE: 63 y.o.  Medical Record #: [id number]  Requesting Physician: Rolan KATHEE Mau, MD    HISTORY OF PRESENT ILLNESS:  Initial history: 05/30/2024: Dr. Theta Ricka Kristina [name] is [name] 63 y.o. female with [name] past medical history of seasonal allergies, OSA, HLD, AV block (type I Mobitz), intermittent vertigo (on meclizine as needed), chronic lower extremity edema, allergic rhinitis, current smoker/tobacco abuse, presenting for pulmonary consultation of dyspnea, wheezing. She was referred by her cardiologist, Dr. Janifer, for evaluation of wheezing and abnormal breathing patterns.  Per review of chart, recently followed with cardiology, 04/24/2024: Mobitz 1 AV block: Possible high-grade AV block, seems to be occurring during sleep, likely vagally mediated.  Plan to repeat event monitor, after she has been on CPAP, and will follow-up for symptomatic bradycardia.  Noted to have CPAP machine, but has had [name] titration study, and is scheduled for follow-up next week with her sleep specialist (neurology).    She has been experiencing wheezing, shortness of breath, and [name] persistent cough since having [name] URI in 09/2023. The cough is deep and persistent, with wheezing more pronounced late at night. She has been using albuterol  for the past four to six months, initially using it more frequently than recommended, about three to four times [name] day, especially during activities like cleaning. She experiences SOB with minimal exertion, including walking and climbing stairs, and reports being short of breath even during basic household activities. She denies [name] morning cough but reports constant wheezing. She reports she expectorates thick white sputum sometimes, but reports that her sputum is clear most of the time.    She has [name] significant smoking history, having started at the age of 80 and currently smoking half [name] pack per day. She  experiences shortness of breath when walking up [name] small hill or half [name] flight of stairs. Seasonal allergies exacerbate her symptoms, with spring and summer being the worst seasons.    She reports chronic lower extremity edema, describing it as persistent swelling in her legs. She has not been hospitalized for pneumonia in the past.    She uses [name] cane due to [name] past incident in 2016 when she received [name] cortisone shot in her left thigh, resulting in numbness for two years.     Interval History 07/16/2024  Kristina [name] is [name] 63 y.o. female with [name] history of obstructive sleep apnea (managed by neurology), hyperlipidemia, Mobitz type I AV block, intermittent vertigo, chronic lower extremity edema, allergic rhinitis, and tobacco use, who presents for pulmonary follow-up. Her dyspnea is multifactorial, likely related to deconditioning, chronic edema, musculoskeletal discomfort, and cardiac conduction abnormalities. At her last visit, she was started on Trelegy 100 for maintenance therapy and advised to continue albuterol  as needed. Further evaluation was recommended, including allergy testing, pulmonary function tests (PFTs), and [name] six-minute walk test ( ). PFTs completed on 06/20/2024 revealed [name] complex restrictive ventilatory impairment on spirometry and lung volumes by nitrogen washout, with [name] normal diffusion capacity (DLCO). Seasonal allergic rhinitis continues to worsen in spring and summer, and she has been unable to perform FeNO testing. The on 06/20/2024 showed she walked 440 feet over 6 minutes with an oxygen nadir of 93%, heart rate up to 118 bpm, and Borg score of 4. Allergy testing remains pending.    Today, we discussed her PFTs, 6-minute walk test, HRCT in  great detail.  Overall, high clinical suspicion for [name] mix of COPD/asthma, given her chronic airway inflammation on CT chest, obstruction on PFTs, and clinical presentation.  At her last visit, trial of Trelegy was given, she used for about [name] week,  but developed voice hoarseness, increasing cough, paradoxical bronchospasms, which point she stopped.  She is currently using albuterol  every other day (2-3 times per day), and still smoking about 3-4 cigarettes/day which is [name] significant reduction compared to her last visit.  Today we discussed [name] trial of [name] non-- powder inhaler such as Breztri, along with [name] trial of [name] Z-Pak given her chronic airway inflammation, continued smoking which she is in agreement with.  She is currently endorsing intermittent cough, at least 2 nights [name] week associated with wheezing, and persistent shortness of breath.  No fevers, night sweats, no COVID-19 exposures.  6-minute walk test without any O2 requirement.         Social History: She began smoking at age 34 and is [name] current smoker, averaging 0.5 packs per day. She denies use of chewing tobacco, recreational drug use, or illicit substances. 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   Family History: She denies any family history of lung cancer.    MEDICATIONS:  Current Medications[1]      PHYSICAL EXAM:  Vitals:    07/16/24 1035   BP: 121/79   Pulse: 82   Resp: 19   Temp: 97.3 F (36.3 C)   SpO2: 99%        Physical Exam  Constitutional:       Appearance: Normal appearance. She is normal weight.   HENT:      Right Ear: Tympanic membrane normal.      Left Ear: Tympanic membrane normal.      Nose: Nose normal.      Mouth/Throat:      Mouth: Mucous membranes are moist.   Eyes:      Pupils: Pupils are equal, round, and reactive to light.   Cardiovascular:      Rate and Rhythm: Normal rate.   Pulmonary:      Breath sounds: No wheezing.      Comments: Shallow breath sounds throughout  Musculoskeletal:      Cervical back: Normal range of motion.      Right lower leg: Edema present.      Left lower leg: Edema present.   Neurological:      General: No focal deficit present.      Mental Status: She is alert and oriented to person, place, and time. Mental status is at baseline.    Psychiatric:         Mood and Affect: Mood normal.         Behavior: Behavior normal.         Thought Content: Thought content normal.           05/02/2024 05/30/2024 07/16/2024   Weight Monitoring   Height 157.5 cm     Weight 112.492 kg 130.45 kg 127.7 kg   BMI (calculated) 45.3 kg/m2         LABS:  Lab Results   Component Value Date    WBC 6.54 06/15/2023    HCT 34.3 (L) 06/15/2023    HGB 10.2 (L) 06/15/2023    PLT 426 (H) 06/15/2023    ABSEOSAUTOMA 0.45 (H) 06/15/2023    ABSEOSAUTOMA 0.21 08/06/2022    ABSEOSAUTOMA 0.23 01/18/2014     Lab Results  Component Value Date    GLU 92 04/02/2024    ALKPHOS 64 06/15/2023    AST 12 06/15/2023    ALT 6 06/15/2023    CREAT 0.6 04/02/2024    CO2 27 04/02/2024      PULMONARY DIAGNOSTICS:    PFT on 06/20/2024: FVC:1.48 (65% ) , FEV1: 1.11 (62% ) , FEV1/FVC:75 (93% ) , TLC (Pleth):0 (0% ) , TLC (N2):3.45 (78% ) , RV:0 (0% ) , DLCO:19.32 (110% )       Impression: Mild-moderate airway obstruction, air trapping, mixed ventilatory defect, normal DLCO    on 06/20/2024: Patient walked 440 ft over 6 minutes with an oxygen nadir of 93% and heart rate up to 118 bpm. Borg 4.     Spirometry on 05/30/2024: FVC: 0.99 L / 40%, FEV1 0.79 L / 40%, FEV1/FVC 79.84%.  Impression: Poor technique, possible obstruction/restriction.    IMAGING:  HRCT 06/15/2024, findings: Small bandlike region of subpleural airspace disease in the posterior right lung base.  Mild associated focal bronchial dilation in this region, could represent chronic bronchiectasis or reactive airway dilation.  Minimal linear airspace disease in the lingula and in the subpleural aspect of the anterior-lateral left lower lobe.  Mild-moderate diffuse peribronchial thickening.  Noted in the right lower lobe, however more pronounced superimposed posterior-medial peribronchial thickening with intraluminal debris and fluid suspicious for bronco pneumonitis    CXR [facility] 07/04/2023: Comparison 08/19/2016.  Findings: Both lungs  are clear.    CXR 07/10/2021: Comparison 02/27/2018: Findings: Lungs are clear without focal infiltrate, no pleural effusion.    Bilateral lower extremity Dopplers 04/30/2021: Normal venous duplex of the lower extremities.  No evidence of DVT    MRI pituitary 08/09/2012: Pituitary gland is normal in contour and signal characteristics.  The suprasellar cistern is widely patent and the stalk is midline in position.  Normal MRI of the pituitary gland    CT head 08/08/2012: Sinuses: Extensive paranasal sinus disease involving the ethmoid sinuses bilaterally and left maxillary sinus.  Left maxillary sinus appears expanded and underlying mass cannot be excluded.    CT sinus 10/30/2007: There is nodular, circumferential mucosal thickening involving the left maxillary sinus resulting in near complete obstruction of the sinus.  Ipsilateral obstruction of the ostiomeatal complexes also noticed.  Patchy mucosal thickening involving the right ethmoid sinus.    Cardiac studies:  TTE on 01/18/2024: LVEF 65%, RV cavity is normal in size, normal RV systolic function, LA is normal in size, RA is normal in size, trace TR.  Insufficient TR jet.  TAPSE 2.11 cm.    Nuclear medicine myocardial perfusion study 04/02/2024: Normal stress and rest myocardial perfusion study without evidence of inducible ischemia or scar.  LVEF greater than 70%.  Compared to 01/2014, there is no significant change.    IMPRESSION & PLAN:  Kristina [name] is [name] 63 y.o. female with [name] past medical history of OSA, HLD, AV block (type I Mobitz), intermittent vertigo (on meclizine as needed), chronic lower extremity edema, allergic rhinitis, current smoker/tobacco abuse, presenting for pulmonary consultation of dyspnea, wheezing.    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  Current symptoms: Chronic cough x 6/8 months after December 2020 for URI, associated with  dyspnea with minimal exertion, and 1-2 ADLs  Current inhalers: Albuterol   Prior inhalers: Trelegy 100: Stopped due to side effects of voice hoarseness, paradoxical bronchospasms  Current albuterol  use:  3-4 times per day, now weaned down to every other day for the past 1 month  Significant smoking history, at least half [name] pack [name] day for 50 years  Exacerbating factors: Continued smoking, obesity, sedentary lifestyle, seasonal allergies, untreated OSA  Spirometry 8/13: Possible mixed restriction and obstruction, however noted with poor technique  PFT 06/2024: Mild-moderate airway obstruction, air trapping, normal DLCO, possible restriction (likely body habitus related)  6-minute walk test 06/2024: Total distance: 440 feet.  Significantly reduced distance, nadir O2 saturation 93%, Borg 4.  Patient does not meet criteria for supplemental O2  HRCT 06/2024: Minimal linear scarring in the lingula, LLL, areas of subpleural airspace disease with associated focal peribronchial thickening, possible chronic airway inflammation/bronchitis.  07/16/2024: At last visit, had [name] trial of Trelegy, had increasing cough, bronchospasms, stopped Trelegy.  Still smoking about 2-3 cigarettes [name] day which is [name] reduction.  Discussed nonpowder inhaler given current clinical symptoms, and PFTs and CT chest which she is in agreement with.  Trial of Breztri.  Noted with chronic airway inflammation on CT chest, with intermittent episodes of productive cough, will treat with Z-Pak in addition to tobacco reduction  Plan: Stop Trelegy 100, start Breztri 2 puffs twice daily, Z-Pak, continue with tobacco reduction and eventual cessation, OSA optimization with her neurologist    Dyspnea on exertion  Multifactorial: Obesity, deconditioning, chronic lower extremity edema, chronic left lower extremity pain/discomfort, continue smoking, Mobitz type I AV block    Bronchospasms    Seasonal allergic rhinitis    Symptoms worsen in spring and summer due to pollen and  grass exposure, possibly contributing to respiratory symptoms.  8/13: Attempted FeNo, patient unable to perform proper technique.  - July 16, 2024 FeNO: 8 ppb (normal<25ppb)  Plan: Check allergy labs,   OSA (follow with neurology)  PSG on 12/24/2022 indicated moderate OSA with an AHI of 18.6   She had [name] titration study on 02/21/2024  Follow up with Neurology.     Tobacco Cessation   Counseling regarding benefits of smoking cessation strategies was provided for 3-5 minutes  Educated that at this time smoking- cessation represents the single most important step that patient can take to enhance the length and quality of live.   Educated patient regarding alternatives of behavior interventions, pharmacotherapy including NRT and non-nicotine therapy such, and combinations of both.   Patient at this time will try to quit on their own.       Return in about 4 months (around 11/15/2024) for f/u at [facility]. Pt is advised to call sooner if any worsening respiratory issues arise.                                              Orders Placed This Encounter   Procedures    Nitric oxide     SIGNED:    Jorie Cha, MD  [facility]         [1]   Current Outpatient Medications:     albuterol  sulfate HFA (PROVENTIL ) 108 (90 Base) MCG/ACT inhaler, Inhale 2 puffs into the lungs every 6 (six) hours as needed for Wheezing or Shortness of Breath, Disp: 1 each, Rfl: 2    atorvastatin  (LIPITOR) 40 MG tablet, Take 1 tablet (40 mg) by mouth daily (Patient taking differently: Take 0.5 tablets (20 mg) by mouth once daily), Disp: 90 tablet, Rfl: 3  Cholecalciferol (Vitamin D ) 50 MCG (2000 UT) Cap, TAKE 1 CAPSULE EVERY WEEK BY MOUTH FOR 84 DAYS, Disp: , Rfl:     clotrimazole (LOTRIMIN) 1 % cream, Apply topically as needed, Disp: , Rfl:     ezetimibe (ZETIA) 10 MG tablet, Take 1 tablet (10 mg) by mouth once daily, Disp: , Rfl:     furosemide  (LASIX ) 40 MG tablet, Take 1 tablet (40 mg total) by mouth daily., Disp: 30 tablet, Rfl: 3     iron-vitamin C (Vitron-C) 65-125 MG Tablet, Take 1 tablet every day by oral route for 90 days., Disp: , Rfl:     lidocaine  (LIDODERM ) 5 %, Place 1 patch onto the skin as needed, Disp: , Rfl:     meclizine (ANTIVERT) 25 MG tablet, Take 1 tablet (25 mg) by mouth every 8 (eight) hours as needed, Disp: , Rfl:     metFORMIN (GLUCOPHAGE) 500 MG tablet, , Disp: , Rfl:     naproxen  (NAPROSYN ) 500 MG tablet, Take 1 tablet (500 mg) by mouth 2 (two) times daily with meals, Disp: , Rfl:     spironolactone  (ALDACTONE ) 25 MG tablet, Take 1 tablet (25 mg) by mouth once daily (Patient taking differently: Take 1 tablet (25 mg) by mouth once daily), Disp: 90 tablet, Rfl: 3    traZODone (DESYREL) 50 MG tablet, as needed, Disp: , Rfl:     triamcinolone  (KENALOG) 0.1 % cream, APPLY CREAM EXTERNALLY TO AFFECTED AREA TWICE DAILY AS NEEDED 90 DAYS, Disp: , Rfl:     azithromycin (ZITHROMAX) 250 MG tablet, Take 2 tablets (500 mg) on  Day 1,  followed by 1 tablet (250 mg) once daily on Days 2 through 5., Disp: 6 tablet, Rfl: 0    Budeson-Glycopyrrol-Formoterol 160-9-4.8 MCG/ACT Aerosol, Inhale 2 puffs into the lungs 2 (two) times daily, Disp: 10.7 g, Rfl: 5    diclofenac sodium (VOLTAREN) 1 % Gel topical gel, Apply topically 4 (four) times daily, Disp: , Rfl:     escitalopram (LEXAPRO) 10 MG tablet, Take 1 tablet (10 mg) by mouth once daily, Disp: , Rfl:     oxyCODONE -acetaminophen  (PERCOCET) 5-325 MG per tablet, TAKE 1/2 TO 1 FULL TABLET BY MOUTH EVERY 12 HOURS AS NEEDED FOR PAIN WITH FOOD AND NONALCOHOLIC BEVERAGES (Patient not taking: No sig reported), Disp: , Rfl:     Spacer/Aero-Holding Chambers Device, Use as directed, Disp: 1 each, Rfl: 0

## 2024-07-16 ENCOUNTER — Ambulatory Visit (INDEPENDENT_AMBULATORY_CARE_PROVIDER_SITE_OTHER): Admitting: Internal Medicine

## 2024-07-16 ENCOUNTER — Encounter: Payer: Self-pay | Admitting: Internal Medicine

## 2024-07-16 VITALS — BP 121/79 | HR 82 | Temp 97.3°F | Resp 19 | Wt 281.5 lb

## 2024-07-16 DIAGNOSIS — J9801 Acute bronchospasm: Secondary | ICD-10-CM

## 2024-07-16 DIAGNOSIS — R062 Wheezing: Secondary | ICD-10-CM

## 2024-07-16 DIAGNOSIS — J4540 Moderate persistent asthma, uncomplicated: Secondary | ICD-10-CM

## 2024-07-16 DIAGNOSIS — R0602 Shortness of breath: Secondary | ICD-10-CM

## 2024-07-16 DIAGNOSIS — J301 Allergic rhinitis due to pollen: Secondary | ICD-10-CM

## 2024-07-16 DIAGNOSIS — F17200 Nicotine dependence, unspecified, uncomplicated: Secondary | ICD-10-CM

## 2024-07-16 DIAGNOSIS — G4733 Obstructive sleep apnea (adult) (pediatric): Secondary | ICD-10-CM

## 2024-07-16 LAB — NITRIC OXIDE

## 2024-07-16 MED ORDER — SPACER/AERO-HOLDING CHAMBERS DEVI
0 refills | Status: AC
Start: 2024-07-16 — End: ?

## 2024-07-16 MED ORDER — AZITHROMYCIN 250 MG PO TABS
ORAL_TABLET | ORAL | 0 refills | Status: AC
Start: 2024-07-16 — End: 2024-07-21

## 2024-07-16 MED ORDER — BUDESON-GLYCOPYRROL-FORMOTEROL 160-9-4.8 MCG/ACT IN AERO: 2.0000 | INHALATION_SPRAY | Freq: Two times a day (BID) | RESPIRATORY_TRACT | 5 refills | Status: DC

## 2024-07-16 NOTE — Patient Instructions (Addendum)
 Follow up with Dr. Theta in 3 months at [facility] clinic.    -   alpha-1 antitrypsin level and blood allergy zone 2 testing , total IgE, Absolute eosinophil count   - stop trelegy, START breztri with spacer.   - Breztri - 2 puff in the AM, and 2 puffs in the PM. Gargle and rinse  with mouthwash (non-alcoholic preferably)  after each use. Take PM dose 1-2 hours prior to bedtime.   - STOP Breztri if the following develop - severe dry mouth, heart palpitations, urinary incontinence, abdominal muscle cramping, constipation, nauseas, vomiting, blurred vision.   - may use the above with spacer  - albuterol  inhaler (RESCUE INHALER) - 2puff every 3-4 hours ONLY as needed for SEVERE shortness of breath\wheezing\recurrent cough. THIS IS A RESCUE INHALER FOR SEVERE SYMPTOMS ONLY.   - MAINTENANCE and RESCUE treatments discussed in detail with the patient.   - tobacco cessation.   - follow up with Dr. Wyman for your OSA and starting Bipap  - July 16, 2024 FeNO: 8 ppb (normal<25ppb)  - diet, exercise and weight loss.   - discuss with your PMD about considering zepbound or manjaro - given OSA with obesity.   - zpak

## 2024-07-25 ENCOUNTER — Other Ambulatory Visit: Payer: Self-pay | Admitting: Family Medicine

## 2024-07-25 DIAGNOSIS — Z1231 Encounter for screening mammogram for malignant neoplasm of breast: Secondary | ICD-10-CM

## 2024-08-02 ENCOUNTER — Ambulatory Visit: Admission: RE | Admit: 2024-08-02 | Source: Ambulatory Visit

## 2024-08-14 ENCOUNTER — Ambulatory Visit: Admitting: Family

## 2024-08-24 ENCOUNTER — Ambulatory Visit

## 2024-09-03 ENCOUNTER — Ambulatory Visit
Admission: RE | Admit: 2024-09-03 | Discharge: 2024-09-03 | Disposition: A | Discharge status: Home / Self Care | Source: Ambulatory Visit | Attending: Family Medicine | Admitting: Family Medicine

## 2024-09-03 DIAGNOSIS — Z1231 Encounter for screening mammogram for malignant neoplasm of breast: Secondary | ICD-10-CM | POA: Insufficient documentation

## 2024-11-12 ENCOUNTER — Encounter (INDEPENDENT_AMBULATORY_CARE_PROVIDER_SITE_OTHER): Payer: Self-pay

## 2024-11-12 NOTE — Progress Notes (Unsigned)
 [place] [facility] FOLLOW UP    Patient Name: Kristina [name],Kristina [name]    Date of Visit:  11/13/2024  Date of Birth: 1960/12/07  AGE: 64 y.o.  Medical Record #: [id number]  Requesting Physician: Rolan KATHEE Mau, MD    HISTORY OF PRESENT ILLNESS:  Initial history: 05/30/2024: Dr. Theta Ricka DELENA Sallye is [name] 64 y.o. female with [name] past medical history of seasonal allergies, OSA, HLD, AV block (type I Mobitz), intermittent vertigo (on meclizine as needed), chronic lower extremity edema, allergic rhinitis, current smoker/tobacco abuse, presenting for pulmonary consultation of dyspnea, wheezing. She was referred by her cardiologist, Dr. Janifer, for evaluation of wheezing and abnormal breathing patterns.  Per review of chart, recently followed with cardiology, 04/24/2024: Mobitz 1 AV block: Possible high-grade AV block, seems to be occurring during sleep, likely vagally mediated.  Plan to repeat event monitor, after she has been on CPAP, and will follow-up for symptomatic bradycardia.  Noted to have CPAP machine, but has had [name] titration study, and is scheduled for follow-up next week with her sleep specialist (neurology).    She has been experiencing wheezing, shortness of breath, and [name] persistent cough since having [name] URI in 09/2023. The cough is deep and persistent, with wheezing more pronounced late at night. She has been using albuterol  for the past four to six months, initially using it more frequently than recommended, about three to four times [name] day, especially during activities like cleaning. She experiences SOB with minimal exertion, including walking and climbing stairs, and reports being short of breath even during basic household activities. She denies [name] morning cough but reports constant wheezing. She reports she expectorates thick white sputum sometimes, but reports that her sputum is clear most of the time.    She has [name] significant smoking history, having started at the age of 55 and currently smoking half [name] pack per day. She  experiences shortness of breath when walking up [name] small hill or half [name] flight of stairs. Seasonal allergies exacerbate her symptoms, with spring and summer being the worst seasons.    She reports chronic lower extremity edema, describing it as persistent swelling in her legs. She has not been hospitalized for pneumonia in the past.    She uses [name] cane due to [name] past incident in 2016 when she received [name] cortisone shot in her left thigh, resulting in numbness for two years.     Interval History 07/16/2024  Kristina [name] is [name] 64 y.o. female with [name] history of obstructive sleep apnea (managed by neurology), hyperlipidemia, Mobitz type I AV block, intermittent vertigo, chronic lower extremity edema, allergic rhinitis, and tobacco use, who presents for pulmonary follow-up. Her dyspnea is multifactorial, likely related to deconditioning, chronic edema, musculoskeletal discomfort, and cardiac conduction abnormalities. At her last visit, she was started on Trelegy 100 for maintenance therapy and advised to continue albuterol  as needed. Further evaluation was recommended, including allergy testing, pulmonary function tests (PFTs), and [name] six-minute walk test ( ). PFTs completed on 06/20/2024 revealed [name] complex restrictive ventilatory impairment on spirometry and lung volumes by nitrogen washout, with [name] normal diffusion capacity (DLCO). Seasonal allergic rhinitis continues to worsen in spring and summer, and she has been unable to perform FeNO testing. The on 06/20/2024 showed she walked 440 feet over 6 minutes with an oxygen nadir of 93%, heart rate up to 118 bpm, and Borg score of 4. Allergy testing remains pending.    Today, we discussed her PFTs, 6-minute walk test, HRCT  in great detail.  Overall, high clinical suspicion for [name] mix of COPD/asthma, given her chronic airway inflammation on CT chest, obstruction on PFTs, and clinical presentation.  At her last visit, trial of Trelegy was given, she used for about [name] week,  but developed voice hoarseness, increasing cough, paradoxical bronchospasms, which point she stopped.  She is currently using albuterol  every other day (2-3 times per day), and still smoking about 3-4 cigarettes/day which is [name] significant reduction compared to her last visit.  Today we discussed [name] trial of [name] non-- powder inhaler such as Breztri, along with [name] trial of [name] Z-Pak given her chronic airway inflammation, continued smoking which she is in agreement with.  She is currently endorsing intermittent cough, at least 2 nights [name] week associated with wheezing, and persistent shortness of breath.  No fevers, night sweats, no COVID-19 exposures.  6-minute walk test without any O2 requirement.       Interval History 11/13/2024   Kristina [name] is [name] 64 y.o. female with [name] history of obstructive sleep apnea (managed by neurology), hyperlipidemia, Mobitz type I AV block, intermittent vertigo, chronic lower extremity edema, allergic rhinitis, and tobacco use, who is connected via video call for pulmonary follow-up.   Last seen on 07/16/2024 and was switched to Breztri inhaler as she has increasing cough and bronchospasm on Trelegy. She was also treated with [name] course of Zpak for chronic airway inflammation noted on CT chest and episodes of productive cough.  She is currently using Albuterol  inhaler 1-2 times [name] day. She has discontinued Breztri as it gave her oral thrush despite using it with spacer and gargling it after use.   She continues to have shortness of breath but has gotten better when compared to last visit. She denies any daily cough, no purulent mucus and chest pain. She has not been sick with any worsening respiratory symptoms, respiratory infections, pneumonia or bronchitis since she was last seen. She has not required any antibiotics, steroids, ER or urgent care visits for COPD or any other respiratory issues.   She has been using BiPAP machine on daily basis and her sleep apnea is managed by neurology. In average,  she uses her BiPAP 3 nights in [name] week.  She is currently on phentermine for weight loss which as per pt, prescribed by her PCP.  Unfortunately, she continues to smoke about 5-6 cigarettes [name] day.    Social History: She began smoking at age 85 and is [name] current smoker, averaging 0.5 packs per day. She denies use of chewing tobacco, recreational drug use, or illicit substances. 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   Family History: She denies any family history of lung cancer.    MEDICATIONS:  Current Medications[1]      PHYSICAL EXAM:telehealth visit  There were no vitals filed for this visit.       Physical Exam  Vitals reviewed: Telemed visit.   Constitutional:       Appearance: Normal appearance.   Neurological:      Mental Status: She is alert and oriented to person, place, and time.   Psychiatric:         Mood and Affect: Mood normal.           05/02/2024 05/30/2024 07/16/2024   Weight Monitoring   Height 157.5 cm     Weight 112.492 kg 130.45 kg 127.7 kg   BMI (calculated) 45.3 kg/m2         LABS:  Lab Results   Component Value Date    WBC 6.54 06/15/2023    HCT 34.3 (L) 06/15/2023    HGB 10.2 (L) 06/15/2023    PLT 426 (H) 06/15/2023    ABSEOSAUTOMA 0.45 (H) 06/15/2023    ABSEOSAUTOMA 0.21 08/06/2022    ABSEOSAUTOMA 0.23 01/18/2014     Lab Results   Component Value Date    GLU 92 04/02/2024    ALKPHOS 64 06/15/2023    AST 12 06/15/2023    ALT 6 06/15/2023    CREAT 0.6 04/02/2024    CO2 27 04/02/2024      PULMONARY DIAGNOSTICS:    PFT on 06/20/2024: FVC:1.48 (65% ) , FEV1: 1.11 (62% ) , FEV1/FVC:75 (93% ) , TLC (Pleth):0 (0% ) , TLC (N2):3.45 (78% ) , RV:0 (0% ) , DLCO:19.32 (110% )       Impression: Mild-moderate airway obstruction, air trapping, mixed ventilatory defect, normal DLCO    on 06/20/2024: Patient walked 440 ft over 6 minutes with an oxygen nadir of 93% and heart rate up to 118 bpm. Borg 4.     Spirometry on 05/30/2024: FVC: 0.99 L / 40%, FEV1 0.79 L / 40%, FEV1/FVC 79.84%.  Impression:  Poor technique, possible obstruction/restriction.    IMAGING:  HRCT 06/15/2024, findings: Small bandlike region of subpleural airspace disease in the posterior right lung base.  Mild associated focal bronchial dilation in this region, could represent chronic bronchiectasis or reactive airway dilation.  Minimal linear airspace disease in the lingula and in the subpleural aspect of the anterior-lateral left lower lobe.  Mild-moderate diffuse peribronchial thickening.  Noted in the right lower lobe, however more pronounced superimposed posterior-medial peribronchial thickening with intraluminal debris and fluid suspicious for bronco pneumonitis    CXR [facility] 07/04/2023: Comparison 08/19/2016.  Findings: Both lungs are clear.    CXR 07/10/2021: Comparison 02/27/2018: Findings: Lungs are clear without focal infiltrate, no pleural effusion.    Bilateral lower extremity Dopplers 04/30/2021: Normal venous duplex of the lower extremities.  No evidence of DVT    MRI pituitary 08/09/2012: Pituitary gland is normal in contour and signal characteristics.  The suprasellar cistern is widely patent and the stalk is midline in position.  Normal MRI of the pituitary gland    CT head 08/08/2012: Sinuses: Extensive paranasal sinus disease involving the ethmoid sinuses bilaterally and left maxillary sinus.  Left maxillary sinus appears expanded and underlying mass cannot be excluded.    CT sinus 10/30/2007: There is nodular, circumferential mucosal thickening involving the left maxillary sinus resulting in near complete obstruction of the sinus.  Ipsilateral obstruction of the ostiomeatal complexes also noticed.  Patchy mucosal thickening involving the right ethmoid sinus.    Cardiac studies:  TTE on 01/18/2024: LVEF 65%, RV cavity is normal in size, normal RV systolic function, LA is normal in size, RA is normal in size, trace TR.  Insufficient TR jet.  TAPSE 2.11 cm.    Nuclear medicine myocardial perfusion study 04/02/2024: Normal  stress and rest myocardial perfusion study without evidence of inducible ischemia or scar.  LVEF greater than 70%.  Compared to 01/2014, there is no significant change.    IMPRESSION & PLAN:  Kristina [name] is [name] 64 y.o. female with [name] past medical history of OSA, HLD, AV block (type I Mobitz), intermittent vertigo (on meclizine as needed), chronic lower extremity edema, allergic rhinitis, current smoker/tobacco abuse, presenting for pulmonary consultation of dyspnea, wheezing.    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  Current symptoms: Chronic cough x 6/8 months after December 2020 for URI, associated with dyspnea with minimal exertion, and 1-2 ADLs  Current inhalers: Albuterol   Prior inhalers: Trelegy 100: Stopped due to side effects of voice hoarseness, paradoxical bronchospasms; Breztri: Stopped as she had thrush despite using spacer  Current albuterol  use: 1-2 times per day  Significant smoking history, at least half [name] pack [name] day for 50 years  Exacerbating factors: Continued smoking, obesity, sedentary lifestyle, seasonal allergies, under treated OSA  Spirometry 8/13: Possible mixed restriction and obstruction, however noted with poor technique  PFT 06/2024: Mild-moderate airway obstruction, air trapping, normal DLCO, possible restriction (likely body habitus related)  6-minute walk test 06/2024: Total distance: 440 feet.  Significantly reduced distance, nadir O2 saturation 93%, Borg 4.  Patient does not meet criteria for supplemental O2  HRCT 06/2024: Minimal linear scarring in the lingula, LLL, areas of subpleural airspace disease with associated focal peribronchial thickening, possible chronic airway inflammation/bronchitis.  trelegy side effects: jitterness  Breztri side effects: thrush  Plan: Stop Breztri; Start Breo QD, continue with tobacco reduction and eventual cessation, OSA optimization  with her neurologist    Dyspnea on exertion  Multifactorial: Obesity, deconditioning, chronic lower extremity edema, chronic left lower extremity pain/discomfort, continue smoking, Mobitz type I AV block    Bronchospasms    Seasonal allergic rhinitis    Symptoms worsen in spring and summer due to pollen and grass exposure, possibly contributing to respiratory symptoms.  8/13: Attempted FeNo, patient unable to perform proper technique.   July 16, 2024 FeNO: 8 ppb (normal<25ppb)  Plan: Check allergy labs    OSA (follow with neurology)  PSG on 12/24/2022 indicated moderate OSA with an AHI of 18.6   She had [name] titration study on 02/21/2024  Using BiPAP at least 3 nights [name] week; encouraged pt on using it daily.  Follow up with Neurology.     Tobacco Cessation   Counseling regarding benefits of smoking cessation strategies was provided for 4 minutes  Educated that at this time smoking- cessation represents the single most important step that patient can take to enhance the length and quality of live.   Educated patient regarding alternatives of behavior interventions, pharmacotherapy including NRT and non-nicotine therapy such, and combinations of both.   Patient at this time will try to quit on their own.   Weight loss medication  On phentermine  Advised to discuss with PCP regarding switching to [name] different medication for weight loss, given side effect of pulmonary hypertension on this.      Return in about 4 months (around 03/13/2025) for f/u at [facility] . Pt is advised to call sooner if any worsening respiratory issues arise.                                              No orders of the defined types were placed in this encounter.    I personally scribed for Jorie Cha, MD on 11/13/2024. The documentation recorded by the scribe, Jerrye Seebeck, accurately reflects the service I personally performed and the decisions made by me, Jorie Cha, MD.    Signed by:  Dr. Jorie Cha  Pulmonary Medicine         [1]   Current  Outpatient Medications:  albuterol  sulfate HFA (PROVENTIL ) 108 (90 Base) MCG/ACT inhaler, Inhale 2 puffs into the lungs every 6 (six) hours as needed for Wheezing or Shortness of Breath, Disp: 1 each, Rfl: 2    atorvastatin  (LIPITOR) 40 MG tablet, Take 1 tablet (40 mg) by mouth daily (Patient taking differently: Take 0.5 tablets (20 mg) by mouth once daily), Disp: 90 tablet, Rfl: 3    Cholecalciferol (Vitamin D ) 50 MCG (2000 UT) Cap, TAKE 1 CAPSULE EVERY WEEK BY MOUTH FOR 84 DAYS, Disp: , Rfl:     clotrimazole (LOTRIMIN) 1 % cream, Apply topically as needed, Disp: , Rfl:     diclofenac sodium (VOLTAREN) 1 % Gel topical gel, Apply topically 4 (four) times daily, Disp: , Rfl:     escitalopram (LEXAPRO) 10 MG tablet, Take 1 tablet (10 mg) by mouth once daily, Disp: , Rfl:     ezetimibe (ZETIA) 10 MG tablet, Take 1 tablet (10 mg) by mouth once daily, Disp: , Rfl:     furosemide  (LASIX ) 40 MG tablet, Take 1 tablet (40 mg total) by mouth daily., Disp: 30 tablet, Rfl: 3    iron-vitamin C (Vitron-C) 65-125 MG Tablet, Take 1 tablet every day by oral route for 90 days., Disp: , Rfl:     lidocaine  (LIDODERM ) 5 %, Place 1 patch onto the skin as needed, Disp: , Rfl:     meclizine (ANTIVERT) 25 MG tablet, Take 1 tablet (25 mg) by mouth every 8 (eight) hours as needed, Disp: , Rfl:     metFORMIN (GLUCOPHAGE) 500 MG tablet, , Disp: , Rfl:     naproxen  (NAPROSYN ) 500 MG tablet, Take 1 tablet (500 mg) by mouth 2 (two) times daily with meals, Disp: , Rfl:     oxyCODONE -acetaminophen  (PERCOCET) 5-325 MG per tablet, TAKE 1/2 TO 1 FULL TABLET BY MOUTH EVERY 12 HOURS AS NEEDED FOR PAIN WITH FOOD AND NONALCOHOLIC BEVERAGES, Disp: , Rfl:     spironolactone  (ALDACTONE ) 25 MG tablet, Take 1 tablet (25 mg) by mouth once daily, Disp: 90 tablet, Rfl: 3    traZODone (DESYREL) 50 MG tablet, as needed, Disp: , Rfl:     triamcinolone  (KENALOG) 0.1 % cream, APPLY CREAM EXTERNALLY TO AFFECTED AREA TWICE DAILY AS NEEDED 90 DAYS, Disp: , Rfl:      Budeson-Glycopyrrol-Formoterol  160-9-4.8 MCG/ACT Aerosol, Inhale 2 puffs into the lungs 2 (two) times daily, Disp: 10.7 g, Rfl: 5    phentermine (ADIPEX-P) 37.5 MG tablet, Take 1 tablet (37.5 mg) by mouth once daily, Disp: , Rfl:     Spacer/Aero-Holding Chambers Device, Use as directed, Disp: 1 each, Rfl: 0

## 2024-11-13 ENCOUNTER — Encounter: Payer: Self-pay | Admitting: Internal Medicine

## 2024-11-13 ENCOUNTER — Telehealth (INDEPENDENT_AMBULATORY_CARE_PROVIDER_SITE_OTHER): Payer: Self-pay | Admitting: Internal Medicine

## 2024-11-13 ENCOUNTER — Telehealth (INDEPENDENT_AMBULATORY_CARE_PROVIDER_SITE_OTHER): Admitting: Internal Medicine

## 2024-11-13 ENCOUNTER — Telehealth: Payer: Self-pay

## 2024-11-13 DIAGNOSIS — J4540 Moderate persistent asthma, uncomplicated: Secondary | ICD-10-CM

## 2024-11-13 DIAGNOSIS — F17210 Nicotine dependence, cigarettes, uncomplicated: Secondary | ICD-10-CM

## 2024-11-13 MED ORDER — FLUTICASONE FUROATE-VILANTEROL 200-25 MCG/ACT IN AEPB: 1.0000 | INHALATION_SPRAY | Freq: Every day | RESPIRATORY_TRACT | 5 refills | Status: AC

## 2024-11-13 NOTE — Telephone Encounter (Signed)
 Called pt x2 to triage for upcoming video visit. Was unable to leave a voice message

## 2024-11-13 NOTE — Patient Instructions (Addendum)
 Follow up with Dr. Theta in 3 months at [facility] clinic.    -   alpha-1 antitrypsin level and blood allergy zone 2 testing , total IgE, Absolute eosinophil count   - trelegy side effects: jitterness  - Breztri side effects: thrush  - Breo 200 /25 - 1 puff daily in the AM. Gargle and rinse  with mouthwash (non-alcoholic preferably)  after each use.   - STOP BREO if the following develop - severe dry mouth, heart palpitations, urinary incontinence, abdominal muscle cramping, constipation, nauseas, vomiting, blurred vision.   - albuterol  inhaler (RESCUE INHALER) - 2puff every 3-4 hours ONLY as needed for SEVERE shortness of breath\wheezing\recurrent cough. THIS IS A RESCUE INHALER FOR SEVERE SYMPTOMS ONLY.   - MAINTENANCE and RESCUE treatments discussed in detail with the patient.   - tobacco cessation.   - follow up with Dr. Wyman for your OSA and Bipap  - July 16, 2024 FeNO: 8 ppb (normal<25ppb)  - diet, exercise and weight loss.   - discuss with your PMD about considering zepbound or manjaro - given OSA with obesity.   - discuss with your PMD about stopping phentermine given high risk for primary pulmonary hypertension and valvular heart disease complications.

## 2024-11-13 NOTE — Telephone Encounter (Signed)
 PA initiated via CMM for: Fluticasone  Furoate-Vilanterol 200-25MCG/ACT aerosol powder     (Key: [id number])  PA Case ID #: [id number]  Awaiting response.

## 2024-11-14 ENCOUNTER — Telehealth: Payer: Self-pay | Admitting: Internal Medicine

## 2024-11-14 NOTE — Telephone Encounter (Signed)
 Called and unable to leave a voicemail as number not available. Sent a mychart message for patient to contact office back to assist scheduling 4 months follow up with Dr Theta at [facility] around 03/13/2025.

## 2024-11-14 NOTE — Telephone Encounter (Signed)
 This medication or product is on your plan's list of covered drugs. Prior authorization is not required at this time. If your pharmacy has questions regarding the processing of your prescription, please have them call the [facility] pharmacy help desk at [phone]. **Please note: This request was submitted electronically. Formulary lowering, tiering exception, cost reduction and/or pre-benefit determination review (including prospective Medicare hospice reviews) requests cannot be requested using this method of submission. Providers contact us  at [phone] for further assistance
